# Patient Record
Sex: Female | Born: 1985 | Hispanic: No | Marital: Married | Smoking: Never smoker
Health system: Southern US, Community
[De-identification: ages and names within clinical notes are randomized; demographics above are authoritative.]

## PROBLEM LIST (undated history)

## (undated) DIAGNOSIS — E282 Polycystic ovarian syndrome: Secondary | ICD-10-CM

## (undated) DIAGNOSIS — O24419 Gestational diabetes mellitus in pregnancy, unspecified control: Secondary | ICD-10-CM

## (undated) DIAGNOSIS — D649 Anemia, unspecified: Secondary | ICD-10-CM

## (undated) DIAGNOSIS — N926 Irregular menstruation, unspecified: Secondary | ICD-10-CM

## (undated) DIAGNOSIS — M2240 Chondromalacia patellae, unspecified knee: Secondary | ICD-10-CM

## (undated) DIAGNOSIS — E781 Pure hyperglyceridemia: Secondary | ICD-10-CM

## (undated) DIAGNOSIS — E119 Type 2 diabetes mellitus without complications: Secondary | ICD-10-CM

## (undated) DIAGNOSIS — R7309 Other abnormal glucose: Secondary | ICD-10-CM

## (undated) DIAGNOSIS — E559 Vitamin D deficiency, unspecified: Secondary | ICD-10-CM

## (undated) HISTORY — DX: Anemia, unspecified: D64.9

## (undated) HISTORY — DX: Type 2 diabetes mellitus without complications: E11.9

## (undated) HISTORY — DX: Chondromalacia patellae, unspecified knee: M22.40

## (undated) HISTORY — DX: Pure hyperglyceridemia: E78.1

## (undated) HISTORY — DX: Vitamin D deficiency, unspecified: E55.9

## (undated) HISTORY — DX: Irregular menstruation, unspecified: N92.6

## (undated) HISTORY — DX: Gestational diabetes mellitus in pregnancy, unspecified control: O24.419

## (undated) HISTORY — DX: Other abnormal glucose: R73.09

## (undated) HISTORY — DX: Polycystic ovarian syndrome: E28.2

---

## 2014-01-29 ENCOUNTER — Emergency Department
Admission: EM | Admit: 2014-01-29 | Discharge: 2014-01-29 | Disposition: A | Discharge status: Home / Self Care | Payer: Managed Care, Other (non HMO) | Source: Home / Self Care | Attending: Family Medicine | Admitting: Family Medicine

## 2014-01-29 ENCOUNTER — Encounter: Payer: Self-pay | Admitting: Emergency Medicine

## 2014-01-29 DIAGNOSIS — R6889 Other general symptoms and signs: Secondary | ICD-10-CM

## 2014-01-29 DIAGNOSIS — J069 Acute upper respiratory infection, unspecified: Secondary | ICD-10-CM

## 2014-01-29 MED ORDER — OSELTAMIVIR PHOSPHATE 75 MG PO CAPS: 75.0000 mg | ORAL_CAPSULE | Freq: Two times a day (BID) | ORAL | Status: DC

## 2014-01-29 NOTE — ED Provider Notes (Signed)
CSN: [id number]     Arrival date & time 01/29/14  1912 History   First MD Initiated Contact with Patient 01/29/14 2000     Chief Complaint  Patient presents with  . Fever  . Nasal Congestion  . Headache      HPI  URI Symptoms Onset: 2 days  Description: body aches, chills, rhinorrhea, nasal congestion, cough  Modifying factors:  No flu shot this year. Also on fertility medication.   Symptoms Nasal discharge: yes Fever: yes Sore throat: no Cough: yes Wheezing: no Ear pain: no GI symptoms: no Sick contacts: no  Red Flags  Stiff neck: no Dyspnea: no Rash: no Swallowing difficulty: no  Sinusitis Risk Factors Headache/face pain: no Double sickening: no tooth pain: no  Allergy Risk Factors Sneezing: no Itchy scratchy throat: no Seasonal symptoms: no  Flu Risk Factors Headache: no muscle aches: yes severe fatigue: yes   History reviewed. No pertinent past medical history. History reviewed. No pertinent past surgical history. Family History  Problem Relation Age of Onset  . Heart attack Father   . Diabetes Father    History  Substance Use Topics  . Smoking status: Never Smoker   . Smokeless tobacco: Not on file  . Alcohol Use: No   OB History   Grav Para Term Preterm Abortions TAB SAB Ect Mult Living                 Review of Systems  All other systems reviewed and are negative.      Allergies  Review of patient's allergies indicates no known allergies.  Home Medications    BP 121/81  Pulse 92  Temp(Src) 99.9 F (37.7 C) (Oral)  Resp 18  Ht 5\' 2"  (1.575 m)  Wt 131 lb (59.421 kg)  BMI 23.95 kg/m2  SpO2 100%  LMP 01/19/2014 Physical Exam  Constitutional: She is oriented to person, place, and time. She appears well-developed and well-nourished.  HENT:  Head: Normocephalic and atraumatic.  Right Ear: External ear normal.  Left Ear: External ear normal.  +nasal erythema, rhinorrhea bilaterally, + post oropharyngeal erythema     Eyes: Conjunctivae are normal. Pupils are equal, round, and reactive to light.  Neck: Normal range of motion. Neck supple.  Cardiovascular: Normal rate and regular rhythm.   Pulmonary/Chest: Effort normal and breath sounds normal.  Abdominal: Soft.  Musculoskeletal: Normal range of motion.  Neurological: She is alert and oriented to person, place, and time.  Skin: Skin is warm.    ED Course  Procedures (including critical care time) Labs Review Labs Reviewed - No data to display Imaging Review No results found.    MDM   Final diagnoses:  URI (upper respiratory infection)  Flu-like symptoms    FLu like sxs  Will treat with tamiflu  Oxygenating well.  Discussed supportive care and infectious/resp red flags.  Follow up as needed.     The patient and/or caregiver has been counseled thoroughly with regard to treatment plan and/or medications prescribed including dosage, schedule, interactions, rationale for use, and possible side effects and they verbalize understanding. Diagnoses and expected course of recovery discussed and will return if not improved as expected or if the condition worsens. Patient and/or caregiver verbalized understanding.         Doree AlbeeSteven Slayton Lubitz, MD 02/04/14 1017

## 2014-01-29 NOTE — ED Notes (Signed)
Pt c/o fever, nasal congestion, and HA x 2 days.

## 2014-02-15 ENCOUNTER — Encounter: Payer: Self-pay | Admitting: Emergency Medicine

## 2014-02-15 ENCOUNTER — Emergency Department
Admission: EM | Admit: 2014-02-15 | Discharge: 2014-02-15 | Disposition: A | Discharge status: Home / Self Care | Payer: Managed Care, Other (non HMO) | Source: Home / Self Care | Attending: Family Medicine | Admitting: Family Medicine

## 2014-02-15 ENCOUNTER — Emergency Department (INDEPENDENT_AMBULATORY_CARE_PROVIDER_SITE_OTHER): Payer: Managed Care, Other (non HMO)

## 2014-02-15 DIAGNOSIS — S92424A Nondisplaced fracture of distal phalanx of right great toe, initial encounter for closed fracture: Secondary | ICD-10-CM

## 2014-02-15 DIAGNOSIS — S92919A Unspecified fracture of unspecified toe(s), initial encounter for closed fracture: Secondary | ICD-10-CM

## 2014-02-15 DIAGNOSIS — X58XXXA Exposure to other specified factors, initial encounter: Secondary | ICD-10-CM

## 2014-02-15 DIAGNOSIS — S90111A Contusion of right great toe without damage to nail, initial encounter: Secondary | ICD-10-CM

## 2014-02-15 DIAGNOSIS — S90129A Contusion of unspecified lesser toe(s) without damage to nail, initial encounter: Secondary | ICD-10-CM

## 2014-02-15 DIAGNOSIS — S90221A Contusion of right lesser toe(s) with damage to nail, initial encounter: Secondary | ICD-10-CM

## 2014-02-15 MED ORDER — HYDROCODONE-ACETAMINOPHEN 5-325 MG PO TABS: ORAL_TABLET | ORAL | Status: DC

## 2014-02-15 NOTE — Discharge Instructions (Signed)
Apply ice pack 3 to 4 times daily for about 10 minutes until swelling resolves.   Subungual Hematoma A subungual hematoma is a pocket of blood that collects under the fingernail or toenail. The pressure created by the blood under the nail can cause pain. CAUSES  A subungual hematoma occurs when an injury to the finger or toe causes a blood vessel beneath the nail to break. The injury can occur from a direct blow such as slamming a finger in a door. It can also occur from a repeated injury such as pressure on the foot in a shoe while running. A subungual hematoma is sometimes called runner's toe or tennis toe. SYMPTOMS   Blue or dark blue skin under the nail.  Pain or throbbing in the injured area. DIAGNOSIS  Your caregiver can determine whether you have a subungual hematoma based on your history and a physical exam. If your caregiver thinks you might have a broken (fractured) bone, X-rays may be taken. TREATMENT  Hematomas usually go away on their own over time. Your caregiver may make a hole in the nail to drain the blood. Draining the blood is painless and usually provides significant relief from pain and throbbing. The nail usually grows back normally after this procedure. In some cases, the nail may need to be removed. This is done if there is a cut under the nail that requires stitches (sutures). HOME CARE INSTRUCTIONS   Put ice on the injured area.  Put ice in a plastic bag.  Place a towel between your skin and the bag.  Leave the ice on for 15-20 minutes, 03-04 times a day for the first 1 to 2 days.  Elevate the injured area to help decrease pain and swelling.  If you were given a bandage, wear it for as long as directed by your caregiver.  If part of your nail falls off, trim the remaining nail gently. This prevents the nail from catching on something and causing further injury.  Only take over-the-counter or prescription medicines for pain, discomfort, or fever as directed by  your caregiver. SEEK IMMEDIATE MEDICAL CARE IF:   You have redness or swelling around the nail.  You have yellowish-white fluid (pus) coming from the nail.  Your pain is not controlled with medicine.  You have a fever. MAKE SURE YOU:  Understand these instructions.  Will watch your condition.  Will get help right away if you are not doing well or get worse. Document Released: 11/23/2000 Document Revised: 02/18/2012 Document Reviewed: 11/14/2011 [facility] Patient Information 2014 [place]   Toe Fracture Your caregiver has diagnosed you as having a fractured toe. A toe fracture is a break in the bone of a toe. "Buddy taping" is a way of splinting your broken toe, by taping the broken toe to the toe next to it. This "buddy taping" will keep the injured toe from moving beyond normal range of motion. Buddy taping also helps the toe heal in a more normal alignment. It may take 6 to 8 weeks for the toe injury to heal. HOME CARE INSTRUCTIONS   Leave your toes taped together for as long as directed by your caregiver or until you see a doctor for a follow-up examination. You can change the tape after bathing. Always use a small piece of gauze or cotton between the toes when taping them together. This will help the skin stay dry and prevent infection.  Apply ice to the injury for 15-20 minutes each hour while awake  for the first 2 days. Put the ice in a plastic bag and place a towel between the bag of ice and your skin.  After the first 2 days, apply heat to the injured area. Use heat for the next 2 to 3 days. Place a heating pad on the foot or soak the foot in warm water as directed by your caregiver.  Keep your foot elevated as much as possible to lessen swelling.  Wear sturdy, supportive shoes. The shoes should not pinch the toes or fit tightly against the toes.  Your caregiver may prescribe a rigid shoe if your foot is very swollen.  Your may be given crutches if the pain is too  great and it hurts too much to walk.  Only take over-the-counter or prescription medicines for pain, discomfort, or fever as directed by your caregiver.  If your caregiver has given you a follow-up appointment, it is very important to keep that appointment. Not keeping the appointment could result in a chronic or permanent injury, pain, and disability. If there is any problem keeping the appointment, you must call back to this facility for assistance. SEEK MEDICAL CARE IF:   You have increased pain or swelling, not relieved with medications.  The pain does not get better after 1 week.  Your injured toe is cold when the others are warm. SEEK IMMEDIATE MEDICAL CARE IF:   The toe becomes cold, numb, or white.  The toe becomes hot (inflamed) and red. Document Released: 11/23/2000 Document Revised: 02/18/2012 Document Reviewed: 07/12/2008 [facility] Patient Information 2014 [place], [place].
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
## 2014-02-15 NOTE — ED Provider Notes (Signed)
CSN: [id number]     Arrival date & time 02/15/14  [id number] History   First MD Initiated Contact with Patient 02/15/14 [id number]     Chief Complaint  Patient presents with  . Toe Injury       HPI Comments: Patient reports that a heavy iron skillet fell on her right great toe last night.  Patient is a 28 y.o. female presenting with toe pain. The history is provided by the patient and the spouse.  Toe Pain This is a new problem. The current episode started 6 to 12 hours ago. The problem occurs constantly. The problem has not changed since onset.Associated symptoms comments: none. The symptoms are aggravated by walking. Nothing relieves the symptoms. She has tried nothing for the symptoms.    History reviewed. No pertinent past medical history. History reviewed. No pertinent past surgical history. Family History  Problem Relation Age of Onset  . Heart attack Father   . Diabetes Father    History  Substance Use Topics  . Smoking status: Never Smoker   . Smokeless tobacco: Not on file  . Alcohol Use: No   OB History   Grav Para Term Preterm Abortions TAB SAB Ect Mult Living                 Review of Systems  All other systems reviewed and are negative.    Allergies  Review of patient's allergies indicates not on file.  Home Medications   Current Outpatient Rx  Name  Route  Sig  Dispense  Refill  . clomiPHENE (CLOMID) 50 MG tablet   Oral   Take by mouth daily.         Marland Kitchen. HYDROcodone-acetaminophen (NORCO/VICODIN) 5-325 MG per tablet      May take one tab PO Q6hr prn pain   12 tablet   0   . UNABLE TO FIND      Med Name:          BP 124/82  Pulse 84  Temp(Src) 98.6 F (37 C) (Oral)  Ht 5\' 2"  (1.575 m)  Wt 132 lb (59.875 kg)  BMI 24.14 kg/m2  SpO2 100%  LMP 01/19/2014 Physical Exam  Nursing note and vitals reviewed. Constitutional: She is oriented to person, place, and time. She appears well-developed and well-nourished. No distress.  Eyes: Conjunctivae are  normal. Pupils are equal, round, and reactive to light.  Musculoskeletal: Normal range of motion. She exhibits tenderness.       Right foot: She exhibits tenderness and bony tenderness. She exhibits normal range of motion, no swelling, normal capillary refill, no deformity and no laceration.       Feet:  Right great toe has sub-ungual hematoma and tenderness over distal phalanx and DIP joint.  Distal neurovascular function is intact.   Neurological: She is alert and oriented to person, place, and time.  Skin: Skin is warm and dry.    ED Course  Procedures  Aspiration of Subungual Hematoma:   Risks and benefits of procedure explained to patient and verbal consent obtained.  Using sterile technique, cleansed toenail and toe with Betadine.  With a disposable electric cautery device, burned a 1mm diameter hole in toenail directly over hematoma.  Blood expressed without difficulty.  Applied Bacitracin and bandage.      Imaging Review Dg Toe Great Right  02/15/2014   CLINICAL DATA:  Pain post trauma  EXAM: RIGHT FIRST TOE  COMPARISON:  None.  FINDINGS: Frontal, oblique, and lateral views were obtained.  There is a transversely oriented fracture through the distal aspect of the first distal phalanx in near anatomic alignment. No other fracture. No dislocation. Joint spaces appear intact.  IMPRESSION: Fracture distal aspect first distal phalanx.   Electronically Signed   By: Bretta Bang M.D.   On: 02/15/2014 09:19     MDM   1. Contusion of right great toe without damage to nail   2. Subungual hematoma of toenail of right foot   3. Closed nondisplaced fracture of distal phalanx of right great toe    Right first and second toes strapped using "buddy tape" technique. Rx for Lortab Apply ice pack 3 to 4 times daily until swelling resolves.  Keep toe bandaged and apply bacitracin daily until any bleeding ceases.  Buddy tape toe. Followup with Dr[facility]) if  not improving about two weeks.     Lattie Haw, MD 02/16/14 1248

## 2014-09-13 ENCOUNTER — Other Ambulatory Visit: Payer: Self-pay | Admitting: Sports Medicine

## 2014-09-13 DIAGNOSIS — Z0000 Encounter for general adult medical examination without abnormal findings: Secondary | ICD-10-CM

## 2014-09-13 NOTE — Progress Notes (Signed)
Patient will be coming for new patient visit. She is here with her husband and requests orders for blood work so that this can be ready when she sees me.

## 2014-10-22 LAB — CBC
HCT: 39.5 % (ref 36.0–46.0)
Hemoglobin: 13.2 g/dL (ref 12.0–15.0)
MCH: 28.3 pg (ref 26.0–34.0)
MCHC: 33.4 g/dL (ref 30.0–36.0)
MCV: 84.6 fL (ref 78.0–100.0)
Platelets: 348 K/uL (ref 150–400)
RBC: 4.67 MIL/uL (ref 3.87–5.11)
RDW: 13.4 % (ref 11.5–15.5)
WBC: 6.4 K/uL (ref 4.0–10.5)

## 2014-10-22 LAB — COMPREHENSIVE METABOLIC PANEL
AST: 16 U/L (ref 0–37)
Albumin: 4.3 g/dL (ref 3.5–5.2)
Alkaline Phosphatase: 54 U/L (ref 39–117)
CO2: 25 mEq/L (ref 19–32)
Calcium: 9.2 mg/dL (ref 8.4–10.5)
Potassium: 4.5 mEq/L (ref 3.5–5.3)
Sodium: 138 mEq/L (ref 135–145)
Total Protein: 7.1 g/dL (ref 6.0–8.3)

## 2014-10-22 LAB — LIPID PANEL
Cholesterol: 143 mg/dL (ref 0–200)
HDL: 47 mg/dL (ref >39)
LDL Cholesterol: 78 mg/dL (ref 0–99)
Total CHOL/HDL Ratio: 3 Ratio
Triglycerides: 89 mg/dL (ref <150)
VLDL: 18 mg/dL (ref 0–40)

## 2014-10-22 LAB — COMPREHENSIVE METABOLIC PANEL WITH GFR
ALT: 15 U/L (ref 0–35)
BUN: 6 mg/dL (ref 6–23)
Chloride: 104 meq/L (ref 96–112)
Creat: 0.72 mg/dL (ref 0.50–1.10)
Glucose, Bld: 98 mg/dL (ref 70–99)
Total Bilirubin: 0.6 mg/dL (ref 0.2–1.2)

## 2014-10-22 LAB — TSH: TSH: 2.532 u[IU]/mL (ref 0.350–4.500)

## 2014-10-22 LAB — HEMOGLOBIN A1C
Hgb A1c MFr Bld: 5.8 % — ABNORMAL HIGH (ref <5.7)
Mean Plasma Glucose: 120 mg/dL — ABNORMAL HIGH (ref <117)

## 2014-10-25 ENCOUNTER — Encounter: Payer: Self-pay | Admitting: Sports Medicine

## 2014-10-25 DIAGNOSIS — Z3009 Encounter for other general counseling and advice on contraception: Secondary | ICD-10-CM | POA: Insufficient documentation

## 2014-10-25 DIAGNOSIS — E282 Polycystic ovarian syndrome: Secondary | ICD-10-CM | POA: Insufficient documentation

## 2014-10-25 DIAGNOSIS — R7303 Prediabetes: Secondary | ICD-10-CM | POA: Insufficient documentation

## 2015-01-24 ENCOUNTER — Ambulatory Visit: Payer: Managed Care, Other (non HMO) | Admitting: Sports Medicine

## 2015-10-04 ENCOUNTER — Ambulatory Visit (INDEPENDENT_AMBULATORY_CARE_PROVIDER_SITE_OTHER): Payer: 59

## 2015-10-04 ENCOUNTER — Ambulatory Visit (INDEPENDENT_AMBULATORY_CARE_PROVIDER_SITE_OTHER): Payer: 59 | Admitting: Sports Medicine

## 2015-10-04 ENCOUNTER — Encounter: Payer: Self-pay | Admitting: Sports Medicine

## 2015-10-04 DIAGNOSIS — M542 Cervicalgia: Secondary | ICD-10-CM

## 2015-10-04 DIAGNOSIS — M545 Low back pain: Secondary | ICD-10-CM | POA: Diagnosis not present

## 2015-10-04 DIAGNOSIS — M25511 Pain in right shoulder: Secondary | ICD-10-CM | POA: Diagnosis not present

## 2015-10-04 DIAGNOSIS — M79609 Pain in unspecified limb: Secondary | ICD-10-CM

## 2015-10-04 DIAGNOSIS — M546 Pain in thoracic spine: Secondary | ICD-10-CM | POA: Diagnosis not present

## 2015-10-04 MED ORDER — ACETAMINOPHEN ER 650 MG PO TBCR: 650.0000 mg | EXTENDED_RELEASE_TABLET | Freq: Three times a day (TID) | ORAL | Status: DC | PRN
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
## 2015-11-23 ENCOUNTER — Telehealth: Payer: Self-pay

## 2015-11-23 DIAGNOSIS — R7303 Prediabetes: Secondary | ICD-10-CM

## 2015-11-23 NOTE — Telephone Encounter (Signed)
Orders placed.

## 2015-11-23 NOTE — Telephone Encounter (Signed)
Pt would like to have labs done before appointment 11/30/15. Please advise

## 2015-11-30 ENCOUNTER — Ambulatory Visit (INDEPENDENT_AMBULATORY_CARE_PROVIDER_SITE_OTHER): Payer: 59 | Admitting: Sports Medicine

## 2015-11-30 DIAGNOSIS — M25561 Pain in right knee: Secondary | ICD-10-CM

## 2015-11-30 DIAGNOSIS — M25562 Pain in left knee: Secondary | ICD-10-CM | POA: Insufficient documentation

## 2015-11-30 DIAGNOSIS — M222X1 Patellofemoral disorders, right knee: Secondary | ICD-10-CM | POA: Insufficient documentation

## 2015-11-30 DIAGNOSIS — Z0000 Encounter for general adult medical examination without abnormal findings: Secondary | ICD-10-CM | POA: Diagnosis not present

## 2015-11-30 LAB — CBC
HCT: 41.2 % (ref 36.0–46.0)
Hemoglobin: 13.6 g/dL (ref 12.0–15.0)
MCH: 29 pg (ref 26.0–34.0)
MCHC: 33 g/dL (ref 30.0–36.0)
MCV: 87.8 fL (ref 78.0–100.0)
MPV: 9.7 fL (ref 8.6–12.4)
Platelets: 334 K/uL (ref 150–400)
RBC: 4.69 MIL/uL (ref 3.87–5.11)
RDW: 12.8 % (ref 11.5–15.5)
WBC: 6.4 K/uL (ref 4.0–10.5)

## 2015-11-30 LAB — HEMOGLOBIN A1C
Hgb A1c MFr Bld: 5.5 % (ref <5.7)
Mean Plasma Glucose: 111 mg/dL (ref <117)

## 2015-11-30 NOTE — Progress Notes (Signed)
  Subjective:    CC: Complete physical exam  HPI:  This is a pleasant 29 year old female, she is here for a routine physical, has no complaints and is fasting.  Family planning: Currently planning a insemination with reproductive endocrinologist. Taking Ovidrel and Femara.  Right knee pain: Present for several weeks, localized anteriorly, worse when exercising, no mechanical symptoms, no swelling, no trauma. Pain is moderate, persistent.  Past medical history, Surgical history, Family history not pertinant except as noted below, Social history, Allergies, and medications have been entered into the medical record, reviewed, and no changes needed.   Review of Systems: No headache, visual changes, nausea, vomiting, diarrhea, constipation, dizziness, abdominal pain, skin rash, fevers, chills, night sweats, swollen lymph nodes, weight loss, chest pain, body aches, joint swelling, muscle aches, shortness of breath, mood changes, visual or auditory hallucinations.  Objective:    General: Well Developed, well nourished, and in no acute distress.  Neuro: Alert and oriented x3, extra-ocular muscles intact, sensation grossly intact. Cranial nerves II through XII are intact, motor, sensory, and coordinative functions are all intact. HEENT: Normocephalic, atraumatic, pupils equal round reactive to light, neck supple, no masses, no lymphadenopathy, thyroid nonpalpable. Oropharynx, nasopharynx, external ear canals are unremarkable. Skin: Warm and dry, no rashes noted.  Cardiac: Regular rate and rhythm, no murmurs rubs or gallops.  Respiratory: Clear to auscultation bilaterally. Not using accessory muscles, speaking in full sentences.  Abdominal: Soft, nontender, nondistended, positive bowel sounds, no masses, no organomegaly.  Right Knee: Normal to inspection with no erythema or effusion or obvious bony abnormalities. Tender to palpation under the lateral patellar facet as well as the lateral femoral  trochlear groove. ROM normal in flexion and extension and lower leg rotation. Ligaments with solid consistent endpoints including ACL, PCL, LCL, MCL. Negative Mcmurray's and provocative meniscal tests. Non painful patellar compression. Patellar and quadriceps tendons unremarkable. Hamstring and quadriceps strength is normal.  Impression and Recommendations:    The patient was counselled, risk factors were discussed, anticipatory guidance given.

## 2015-11-30 NOTE — Assessment & Plan Note (Signed)
Pain under the lateral patellar facet and the femoral throat clear groove, this likely represents patellofemoral pain syndrome. We will start conservatively with rehabilitation exercises.

## 2015-11-30 NOTE — Assessment & Plan Note (Signed)
Routine blood work as above, physical performed. Referral to OB/GYN for cervical cancer screening.

## 2015-12-01 LAB — LIPID PANEL
Cholesterol: 152 mg/dL (ref 125–200)
HDL: 50 mg/dL (ref >=46)
LDL Cholesterol: 84 mg/dL (ref <130)
Total CHOL/HDL Ratio: 3 Ratio (ref <=5.0)
Triglycerides: 92 mg/dL (ref <150)
VLDL: 18 mg/dL (ref <30)

## 2015-12-01 LAB — COMPREHENSIVE METABOLIC PANEL
ALT: 14 U/L (ref 6–29)
AST: 18 U/L (ref 10–30)
Albumin: 4.3 g/dL (ref 3.6–5.1)
Alkaline Phosphatase: 49 U/L (ref 33–115)
BUN: 7 mg/dL (ref 7–25)
Calcium: 9.6 mg/dL (ref 8.6–10.2)
Chloride: 98 mmol/L (ref 98–110)
Potassium: 4.4 mmol/L (ref 3.5–5.3)
Total Bilirubin: 0.8 mg/dL (ref 0.2–1.2)
Total Protein: 7 g/dL (ref 6.1–8.1)

## 2015-12-01 LAB — COMPREHENSIVE METABOLIC PANEL WITH GFR
CO2: 32 mmol/L — ABNORMAL HIGH (ref 20–31)
Creat: 0.58 mg/dL (ref 0.50–1.10)
Glucose, Bld: 96 mg/dL (ref 65–99)
Sodium: 136 mmol/L (ref 135–146)

## 2015-12-01 LAB — VITAMIN D 25 HYDROXY (VIT D DEFICIENCY, FRACTURES): Vit D, 25-Hydroxy: 16 ng/mL — ABNORMAL LOW (ref 30–100)

## 2015-12-01 MED ORDER — VITAMIN D (ERGOCALCIFEROL) 1.25 MG (50000 UNIT) PO CAPS: 50000.0000 [IU] | ORAL_CAPSULE | ORAL | Status: DC

## 2015-12-01 NOTE — Addendum Note (Signed)
Addended by: Monica BectonHEKKEKANDAM, Alyne Martinson J on: 12/01/2015 09:20 AM   Modules accepted: Orders

## 2016-01-16 ENCOUNTER — Encounter: Payer: Self-pay | Admitting: Sports Medicine

## 2016-01-16 ENCOUNTER — Ambulatory Visit (INDEPENDENT_AMBULATORY_CARE_PROVIDER_SITE_OTHER): Payer: 59 | Admitting: Sports Medicine

## 2016-01-16 VITALS — BP 110/86 | HR 76 | Resp 18 | Wt 134.0 lb

## 2016-01-16 DIAGNOSIS — R040 Epistaxis: Secondary | ICD-10-CM

## 2016-01-16 NOTE — Progress Notes (Signed)
  Subjective:    CC: "my nose has been bleeding"  HPI: Patient presents with a 3 day history of nose bleeds. She has had 3 episodes that began on Saturday when she and her husband spent the night at a friends house and she woke up with the bleed. The second episode was similar and while she was still at her friends house. The last episode happened at her own home when she noticed some blood crusting on her left nare and picked at it. Each episode was brief (<2 minutes) and resolved spontaneously. She denies any trauma to the area preceding the first event. She notes that she has had nose bleeds before and they are always associated with staying in a dry environment. She notes that her friends house had the heat on and was a dry environment. She denies a history of bleeding in herself or family members. She reports menstrual irregularities, but no increased bleeding was noted. She denies fatigue, pallor, sore throat, ear pain, or a cough.  Past medical history, Surgical history, Family history not pertinant except as noted below, Social history, Allergies, and medications have been entered into the medical record, reviewed, and no changes needed.   Review of Systems: No fevers, chills, night sweats, weight loss, chest pain, or shortness of breath.   Objective:    General: Well Developed, well nourished, and in no acute distress.  Neuro: Alert and oriented x3, extra-ocular muscles intact, sensation grossly intact.  HEENT: Normocephalic, atraumatic, pupils equal round reactive to light, neck supple, no masses, no lymphadenopathy, thyroid nonpalpable, left nare remarkable for coagulated blood and a lateral small ulcer, right nare unremarkable, TM without bulge or erythema bilaterally. Skin: Warm and dry, no rashes. Cardiac: Regular rate and rhythm, no murmurs rubs or gallops, no lower extremity edema.  Respiratory: Clear to auscultation bilaterally. Not using accessory muscles, speaking in full  sentences.  Impression and Recommendations:    Patient's presentation consistent with benign epistaxis secondary to dry environments. Patient reports having a humidifier at home. She was instructed to use the humidifier and prophylacticly apply Vaseline or antibiotic ointment when going into known dry environments.

## 2016-01-16 NOTE — Assessment & Plan Note (Signed)
Left nasal nare, there is a bit of ulceration along the lateral mucosa. Applied antibiotic ointment, recommended application of Vaseline or other antibiotic ointment when exposed to warm and dry environments.

## 2016-01-23 ENCOUNTER — Encounter: Payer: Self-pay | Admitting: Obstetrics & Gynecology

## 2016-01-23 ENCOUNTER — Ambulatory Visit (INDEPENDENT_AMBULATORY_CARE_PROVIDER_SITE_OTHER): Payer: 59 | Admitting: Obstetrics & Gynecology

## 2016-01-23 VITALS — BP 121/79 | HR 90 | Ht 62.0 in | Wt 132.0 lb

## 2016-01-23 DIAGNOSIS — Z1329 Encounter for screening for other suspected endocrine disorder: Secondary | ICD-10-CM | POA: Diagnosis not present

## 2016-01-23 DIAGNOSIS — Z124 Encounter for screening for malignant neoplasm of cervix: Secondary | ICD-10-CM

## 2016-01-23 DIAGNOSIS — Z0000 Encounter for general adult medical examination without abnormal findings: Secondary | ICD-10-CM

## 2016-01-23 DIAGNOSIS — Z1151 Encounter for screening for human papillomavirus (HPV): Secondary | ICD-10-CM

## 2016-01-23 DIAGNOSIS — Z01419 Encounter for gynecological examination (general) (routine) without abnormal findings: Secondary | ICD-10-CM | POA: Diagnosis not present

## 2016-01-23 LAB — TSH: TSH: 2.2 mIU/L

## 2016-01-23 NOTE — Progress Notes (Signed)
Subjective:    Michele Patterson is a 30 y.o. M [place] G1 P0A1 (sp at less than 12 week) female who presents for an annual exam. The patient has no complaints today. She is wanting a pregnancy. She took femara with Dr. Vernice Jefferson at [facility].  She recently moved from [place] to [place]. The patient is sexually active. GYN screening history: last pap: was normal. In 2014 The patient wears seatbelts: yes. The patient participates in regular exercise: yes. Has the patient ever been transfused or tattooed?: no. The patient reports that there is not domestic violence in her life.   Menstrual History: OB History    Gravida Para Term Preterm AB TAB SAB Ectopic Multiple Living   Menarche age: 30  Patient's last menstrual period was 10/25/2015.    The following portions of the patient's history were reviewed and updated as appropriate: allergies, current medications, past family history, past medical history, past social history, past surgical history and problem list.  Review of Systems Pertinent items noted in HPI and remainder of comprehensive ROS otherwise negative. Married for 4 year, denies dyspareunia. She is a Futures trader. Declines a flu vaccine. She took the femara for a total of 3 cycles. She is planning to do IUI.   Objective:    BP 121/79 mmHg  Pulse 90  Ht  (1.575 m)  Wt 132 lb (59.875 kg)  BMI 24.14 kg/m2  LMP 10/25/2015  General Appearance:    Alert, cooperative, no distress, appears stated age  Head:    Normocephalic, without obvious abnormality, atraumatic  Eyes:    PERRL, conjunctiva/corneas clear, EOM's intact, fundi    benign, both eyes  Ears:    Normal TM's and external ear canals, both ears  Nose:   Nares normal, septum midline, mucosa normal, no drainage    or sinus tenderness  Throat:   Lips, mucosa, and tongue normal; teeth and gums normal  Neck:   Supple, symmetrical, trachea midline, no adenopathy;    thyroid:  no  enlargement/tenderness/nodules; no carotid   bruit or JVD  Back:     Symmetric, no curvature, ROM normal, no CVA tenderness  Lungs:     Clear to auscultation bilaterally, respirations unlabored  Chest Wall:    No tenderness or deformity   Heart:    Regular rate and rhythm, S1 and S2 normal, no murmur, rub   or gallop  Breast Exam:    No tenderness, masses, or nipple abnormality  Abdomen:     Soft, non-tender, bowel sounds active all four quadrants,    no masses, no organomegaly  Genitalia:    Normal female without lesion, discharge or tenderness, NSSR, NT, mobile, normal adnexal exam     Extremities:   Extremities normal, atraumatic, no cyanosis or edema  Pulses:   2+ and symmetric all extremities  Skin:   Skin color, texture, turgor normal, no rashes or lesions  Lymph nodes:   Cervical, supraclavicular, and axillary nodes normal  Neurologic:   CNII-XII intact, normal strength, sensation and reflexes    throughout  .    Assessment:    Healthy female exam.    Plan:     Breast self exam technique reviewed and patient encouraged to perform self-exam monthly. Thin prep Pap smear. with cotesting Rec PNVs daily

## 2016-01-23 NOTE — Progress Notes (Signed)
Patient trying to conceive. [facility] Armandina Stammer RN BSN

## 2016-01-24 LAB — CYTOLOGY - PAP

## 2016-02-27 ENCOUNTER — Ambulatory Visit: Payer: 59 | Admitting: Sports Medicine

## 2016-10-19 ENCOUNTER — Telehealth: Payer: Self-pay | Admitting: Sports Medicine

## 2016-10-19 DIAGNOSIS — Z0000 Encounter for general adult medical examination without abnormal findings: Secondary | ICD-10-CM

## 2016-10-19 NOTE — Telephone Encounter (Signed)
Pt called in to schedule CPE for 12/07/16. Pt requested lab orders be put in before her appointment. Thanks!

## 2016-10-23 NOTE — Telephone Encounter (Signed)
Fasting labs ordered

## 2016-12-07 ENCOUNTER — Encounter: Payer: Self-pay | Admitting: Sports Medicine

## 2016-12-07 ENCOUNTER — Ambulatory Visit (INDEPENDENT_AMBULATORY_CARE_PROVIDER_SITE_OTHER): Payer: 59 | Admitting: Sports Medicine

## 2016-12-07 ENCOUNTER — Ambulatory Visit (INDEPENDENT_AMBULATORY_CARE_PROVIDER_SITE_OTHER): Payer: 59

## 2016-12-07 DIAGNOSIS — M25562 Pain in left knee: Secondary | ICD-10-CM

## 2016-12-07 DIAGNOSIS — M25561 Pain in right knee: Secondary | ICD-10-CM

## 2016-12-07 DIAGNOSIS — Z0000 Encounter for general adult medical examination without abnormal findings: Secondary | ICD-10-CM | POA: Diagnosis not present

## 2016-12-07 DIAGNOSIS — M1712 Unilateral primary osteoarthritis, left knee: Secondary | ICD-10-CM | POA: Diagnosis not present

## 2016-12-07 LAB — CBC
HCT: 40.3 % (ref 35.0–45.0)
Hemoglobin: 13.3 g/dL (ref 11.7–15.5)
MCH: 29.4 pg (ref 27.0–33.0)
MCHC: 33 g/dL (ref 32.0–36.0)
MCV: 89 fL (ref 80.0–100.0)
MPV: 9.3 fL (ref 7.5–12.5)
Platelets: 399 K/uL (ref 140–400)
RBC: 4.53 MIL/uL (ref 3.80–5.10)
RDW: 13 % (ref 11.0–15.0)
WBC: 5.7 10*3/uL (ref 3.8–10.8)

## 2016-12-07 LAB — LIPID PANEL
Cholesterol: 152 mg/dL (ref <200)
HDL: 46 mg/dL — ABNORMAL LOW (ref >50)
LDL Cholesterol: 91 mg/dL (ref <100)
Total CHOL/HDL Ratio: 3.3 Ratio (ref <5.0)
Triglycerides: 73 mg/dL (ref <150)
VLDL: 15 mg/dL (ref <30)

## 2016-12-07 LAB — COMPREHENSIVE METABOLIC PANEL WITH GFR
Alkaline Phosphatase: 50 U/L (ref 33–115)
BUN: 8 mg/dL (ref 7–25)
Calcium: 9.4 mg/dL (ref 8.6–10.2)
Potassium: 4.5 mmol/L (ref 3.5–5.3)
Total Protein: 7.2 g/dL (ref 6.1–8.1)

## 2016-12-07 LAB — COMPREHENSIVE METABOLIC PANEL
ALT: 11 U/L (ref 6–29)
AST: 16 U/L (ref 10–30)
Albumin: 4.4 g/dL (ref 3.6–5.1)
CO2: 26 mmol/L (ref 20–31)
Chloride: 102 mmol/L (ref 98–110)
Creat: 0.66 mg/dL (ref 0.50–1.10)
Glucose, Bld: 99 mg/dL (ref 65–99)
Sodium: 139 mmol/L (ref 135–146)
Total Bilirubin: 0.5 mg/dL (ref 0.2–1.2)

## 2016-12-07 LAB — TSH: TSH: 2.77 m[IU]/L

## 2016-12-07 NOTE — Assessment & Plan Note (Signed)
Unremarkable physical. Currently going through fertility treatments. Checking routine blood work. Declines flu vaccine, up-to-date on other vaccinations.

## 2016-12-07 NOTE — Progress Notes (Signed)
  Subjective:    CC: Annual physical exam   HPI:  This is a pleasant 30 year old female here for her physical, she has no complaint with the exception of some mild left knee pain, worse with standing, localized in the posterior medial joint line, no mechanical symptoms, no swelling. No trauma.  She is currently going through fertility treatments with her husband, they are about to start another cycle of intrauterine insemination.  Past medical history:  Negative.  See flowsheet/record as well for more information.  Surgical history: Negative.  See flowsheet/record as well for more information.  Family history: Negative.  See flowsheet/record as well for more information.  Social history: Negative.  See flowsheet/record as well for more information.  Allergies, and medications have been entered into the medical record, reviewed, and no changes needed.    Review of Systems: No headache, visual changes, nausea, vomiting, diarrhea, constipation, dizziness, abdominal pain, skin rash, fevers, chills, night sweats, swollen lymph nodes, weight loss, chest pain, body aches, joint swelling, muscle aches, shortness of breath, mood changes, visual or auditory hallucinations.  Objective:    General: Well Developed, well nourished, and in no acute distress.  Neuro: Alert and oriented x3, extra-ocular muscles intact, sensation grossly intact. Cranial nerves II through XII are intact, motor, sensory, and coordinative functions are all intact. HEENT: Normocephalic, atraumatic, pupils equal round reactive to light, neck supple, no masses, no lymphadenopathy, thyroid nonpalpable. Oropharynx, nasopharynx, external ear canals are unremarkable. Skin: Warm and dry, no rashes noted.  Cardiac: Regular rate and rhythm, no murmurs rubs or gallops.  Respiratory: Clear to auscultation bilaterally. Not using accessory muscles, speaking in full sentences.  Abdominal: Soft, nontender, nondistended, positive bowel sounds,  no masses, no organomegaly.  Left Knee: Normal to inspection with no erythema or effusion or obvious bony abnormalities. Only minimal tenderness at the posterior medial joint line ROM normal in flexion and extension and lower leg rotation. Ligaments with solid consistent endpoints including ACL, PCL, LCL, MCL. Negative Mcmurray's and provocative meniscal tests. Non painful patellar compression. Patellar and quadriceps tendons unremarkable. Hamstring and quadriceps strength is normal.  Impression and Recommendations:    The patient was counselled, risk factors were discussed, anticipatory guidance given.  Annual physical exam Unremarkable physical. Currently going through fertility treatments. Checking routine blood work. Declines flu vaccine, up-to-date on other vaccinations.  Left knee pain Posterior medial joint line pain.  We will start conservatively with x-rays, she can use over-the-counter analgesics as desired. Rehabilitation exercises.  Return to see me in one month, MRI for no better.

## 2016-12-07 NOTE — Assessment & Plan Note (Signed)
Posterior medial joint line pain.  We will start conservatively with x-rays, she can use over-the-counter analgesics as desired. Rehabilitation exercises.  Return to see me in one month, MRI for no better.

## 2016-12-08 ENCOUNTER — Encounter: Payer: Self-pay | Admitting: Obstetrics & Gynecology

## 2016-12-08 LAB — VITAMIN D 25 HYDROXY (VIT D DEFICIENCY, FRACTURES): Vit D, 25-Hydroxy: 30 ng/mL (ref 30–100)

## 2016-12-08 LAB — HEMOGLOBIN A1C
Hgb A1c MFr Bld: 5.5 % (ref <5.7)
Mean Plasma Glucose: 111 mg/dL

## 2016-12-11 ENCOUNTER — Encounter: Payer: 59 | Admitting: Sports Medicine

## 2017-02-15 LAB — OB RESULTS CONSOLE GC/CHLAMYDIA
Chlamydia: NEGATIVE
Gonorrhea: NEGATIVE

## 2017-02-15 LAB — OB RESULTS CONSOLE RUBELLA ANTIBODY, IGM: RUBELLA: IMMUNE

## 2017-02-15 LAB — OB RESULTS CONSOLE HEPATITIS B SURFACE ANTIGEN: HEP B S AG: NEGATIVE

## 2017-02-15 LAB — OB RESULTS CONSOLE ANTIBODY SCREEN: Antibody Screen: NEGATIVE

## 2017-02-15 LAB — OB RESULTS CONSOLE ABO/RH: RH Type: POSITIVE

## 2017-02-15 LAB — OB RESULTS CONSOLE HIV ANTIBODY (ROUTINE TESTING): HIV: NONREACTIVE

## 2017-02-15 LAB — OB RESULTS CONSOLE RPR: RPR: NONREACTIVE

## 2017-06-20 DIAGNOSIS — Z23 Encounter for immunization: Secondary | ICD-10-CM | POA: Diagnosis not present

## 2017-07-09 ENCOUNTER — Encounter: Payer: 59 | Attending: Obstetrics & Gynecology | Admitting: Skilled Nursing Facility1

## 2017-07-09 ENCOUNTER — Encounter: Payer: Self-pay | Admitting: Skilled Nursing Facility1

## 2017-07-09 DIAGNOSIS — Z3A00 Weeks of gestation of pregnancy not specified: Secondary | ICD-10-CM | POA: Diagnosis not present

## 2017-07-09 DIAGNOSIS — O24410 Gestational diabetes mellitus in pregnancy, diet controlled: Secondary | ICD-10-CM

## 2017-07-09 DIAGNOSIS — O99810 Abnormal glucose complicating pregnancy: Secondary | ICD-10-CM | POA: Diagnosis not present

## 2017-07-09 DIAGNOSIS — Z713 Dietary counseling and surveillance: Secondary | ICD-10-CM | POA: Insufficient documentation

## 2017-07-09 NOTE — Progress Notes (Signed)
Pt arrives with her husband. Pt states she is Having heart burn interrupting her sleep. Pt and her husband and inquisitive. Pts husband believes his wife to be overly restricted and states she has not gained any weight in the past 2 weeks.  Pt has a previous diagnosis of PCOS.  [redacted] weeks along.   Pts blood sugar numbers 90, 176, 100, 93, 136, 98, 131,112, 114, 95, 108, 110 Diabetes Self-Management Education  Visit Type: First/Initial  07/09/2017  [name], identified by name and date of birth, is a 31 y.o. female with a diagnosis of Diabetes: Gestational Diabetes.   ASSESSMENT  Height 5\' 2"  (1.575 m), weight 148 lb 11.2 oz (67.4 kg). Body mass index is 27.2 kg/m.      Diabetes Self-Management Education - 07/09/17 0814      Visit Information   Visit Type First/Initial     Initial Visit   Diabetes Type Gestational Diabetes   Are you currently following a meal plan? No   Are you taking your medications as prescribed? Not on Medications     Health Coping   How would you rate your overall health? Good     Psychosocial Assessment   Patient Belief/Attitude about Diabetes Motivated to manage diabetes   Other persons present Spouse/SO     Pre-Education Assessment   Patient understands the diabetes disease and treatment process. Needs Instruction   Patient understands incorporating nutritional management into lifestyle. Needs Instruction   Patient undertands incorporating physical activity into lifestyle. Needs Instruction   Patient understands using medications safely. Needs Instruction   Patient understands monitoring blood glucose, interpreting and using results Needs Instruction   Patient understands prevention, detection, and treatment of acute complications. Needs Instruction   Patient understands prevention, detection, and treatment of chronic complications. Needs Instruction   Patient understands how to develop strategies to address psychosocial issues.  Needs Instruction   Patient understands how to develop strategies to promote health/change behavior. Needs Instruction     Complications   How often do you check your blood sugar? 3-4 times/day   Fasting Blood glucose range (mg/dL) [id number]   Postprandial Blood glucose range (mg/dL) [phone]   Number of hypoglycemic episodes per month 0   Number of hyperglycemic episodes per week 0   Have you had a dilated eye exam in the past 12 months? No   Have you had a dental exam in the past 12 months? No   Are you checking your feet? No     Dietary Intake   Breakfast 1 egg and butter and half avacado   Snack (morning) cheesstick or crackers with peanutbutter    Lunch spinach or other green leafys with bitter gourd and rice and dahl   Snack (afternoon) cheese or tea with milk or 6 ounces whole milk    Dinner lentil pancake and rice   Snack (evening) cheestick or cracker with butter    Beverage(s) water, milk with safron, buttermilk made by her, tea with milk and tea powder and ginger      Exercise   Exercise Type Light (walking / raking leaves)   How many days per week to you exercise? 6   How many minutes per day do you exercise? 60   Total minutes per week of exercise 360     Patient Education   Previous Diabetes Education No   Disease state  Factors that contribute to the development of diabetes   Nutrition management  Role of diet in the treatment  of diabetes and the relationship between the three main macronutrients and blood glucose level;Food label reading, portion sizes and measuring food.;Carbohydrate counting;Reviewed blood glucose goals for pre and post meals and how to evaluate the patients' food intake on their blood glucose level.   Physical activity and exercise  Role of exercise on diabetes management, blood pressure control and cardiac health.   Monitoring Purpose and frequency of SMBG.   Acute complications Taught treatment of hypoglycemia - the 15 rule.   Chronic complications  Relationship between chronic complications and blood glucose control   Psychosocial adjustment Role of stress on diabetes   Preconception care Reviewed with patient blood glucose goals with pregnancy     Individualized Goals (developed by patient)   Nutrition Follow meal plan discussed;General guidelines for healthy choices and portions discussed   Physical Activity Exercise 5-7 days per week;30 minutes per day   Monitoring  test my blood glucose as discussed;test blood glucose pre and post meals as discussed     Post-Education Assessment   Patient understands the diabetes disease and treatment process. Demonstrates understanding / competency   Patient understands incorporating nutritional management into lifestyle. Demonstrates understanding / competency   Patient undertands incorporating physical activity into lifestyle. Demonstrates understanding / competency   Patient understands using medications safely. Demonstrates understanding / competency   Patient understands monitoring blood glucose, interpreting and using results Demonstrates understanding / competency   Patient understands prevention, detection, and treatment of acute complications. Demonstrates understanding / competency   Patient understands prevention, detection, and treatment of chronic complications. Demonstrates understanding / competency   Patient understands how to develop strategies to address psychosocial issues. Demonstrates understanding / competency   Patient understands how to develop strategies to promote health/change behavior. Demonstrates understanding / competency     Outcomes   Expected Outcomes Demonstrated interest in learning. Expect positive outcomes   Future DMSE PRN   Program Status Completed      Individualized Plan for Diabetes Self-Management Training:   Learning Objective:  Patient will have a greater understanding of diabetes self-management. Patient education plan is to attend individual  and/or group sessions per assessed needs and concerns.   Plan:   -Try alkaline water -try a snack or a walk before bed for fasting numbers   Expected Outcomes:  Demonstrated interest in learning. Expect positive outcomes  Education material provided: Living Well with Diabetes, Meal plan card, My Plate and Snack sheet  If problems or questions, patient to contact team via:  Phone  Future DSME appointment: PRN

## 2017-08-07 LAB — OB RESULTS CONSOLE GBS: GBS: NEGATIVE

## 2017-08-29 ENCOUNTER — Other Ambulatory Visit: Payer: Self-pay | Admitting: Obstetrics & Gynecology

## 2017-08-29 ENCOUNTER — Encounter (HOSPITAL_COMMUNITY): Payer: Self-pay | Admitting: *Deleted

## 2017-08-29 ENCOUNTER — Telehealth (HOSPITAL_COMMUNITY): Payer: Self-pay | Admitting: *Deleted

## 2017-08-29 NOTE — Telephone Encounter (Signed)
Preadmission screen  

## 2017-09-02 ENCOUNTER — Encounter (HOSPITAL_COMMUNITY): Payer: Self-pay | Admitting: *Deleted

## 2017-09-06 ENCOUNTER — Encounter (HOSPITAL_COMMUNITY): Payer: Self-pay

## 2017-09-06 ENCOUNTER — Inpatient Hospital Stay (HOSPITAL_COMMUNITY): Admission: AD | Admit: 2017-09-06 | Payer: 59 | Source: Ambulatory Visit | Admitting: Obstetrics & Gynecology

## 2017-09-06 ENCOUNTER — Inpatient Hospital Stay (HOSPITAL_COMMUNITY)
Admission: RE | Admit: 2017-09-06 | Discharge: 2017-09-10 | DRG: 786 | Disposition: A | Discharge status: Home / Self Care | Payer: 59 | Source: Ambulatory Visit | Attending: Obstetrics & Gynecology | Admitting: Obstetrics & Gynecology

## 2017-09-06 DIAGNOSIS — F419 Anxiety disorder, unspecified: Secondary | ICD-10-CM | POA: Diagnosis present

## 2017-09-06 DIAGNOSIS — O24410 Gestational diabetes mellitus in pregnancy, diet controlled: Secondary | ICD-10-CM | POA: Diagnosis present

## 2017-09-06 DIAGNOSIS — O9081 Anemia of the puerperium: Secondary | ICD-10-CM | POA: Diagnosis not present

## 2017-09-06 DIAGNOSIS — O24420 Gestational diabetes mellitus in childbirth, diet controlled: Principal | ICD-10-CM | POA: Diagnosis present

## 2017-09-06 DIAGNOSIS — O411230 Chorioamnionitis, third trimester, not applicable or unspecified: Secondary | ICD-10-CM | POA: Diagnosis present

## 2017-09-06 DIAGNOSIS — D62 Acute posthemorrhagic anemia: Secondary | ICD-10-CM | POA: Diagnosis not present

## 2017-09-06 DIAGNOSIS — Z3A40 40 weeks gestation of pregnancy: Secondary | ICD-10-CM

## 2017-09-06 DIAGNOSIS — Z23 Encounter for immunization: Secondary | ICD-10-CM | POA: Diagnosis not present

## 2017-09-06 DIAGNOSIS — O99344 Other mental disorders complicating childbirth: Secondary | ICD-10-CM | POA: Diagnosis present

## 2017-09-06 DIAGNOSIS — Z3490 Encounter for supervision of normal pregnancy, unspecified, unspecified trimester: Secondary | ICD-10-CM | POA: Diagnosis present

## 2017-09-06 LAB — TYPE AND SCREEN
ABO/RH(D): O POS
Antibody Screen: NEGATIVE

## 2017-09-06 LAB — CBC
HCT: 36.9 % (ref 36.0–46.0)
HEMOGLOBIN: 12.8 g/dL (ref 12.0–15.0)
MCH: 30.5 pg (ref 26.0–34.0)
MCHC: 34.7 g/dL (ref 30.0–36.0)
MCV: 88.1 fL (ref 78.0–100.0)
PLATELETS: 182 10*3/uL (ref 150–400)
RBC: 4.19 MIL/uL (ref 3.87–5.11)
RDW: 14.3 % (ref 11.5–15.5)
WBC: 7.7 10*3/uL (ref 4.0–10.5)

## 2017-09-06 LAB — GLUCOSE, CAPILLARY
GLUCOSE-CAPILLARY: 96 mg/dL (ref 65–99)
Glucose-Capillary: 101 mg/dL — ABNORMAL HIGH (ref 65–99)

## 2017-09-06 LAB — GLUCOSE, RANDOM: GLUCOSE: 116 mg/dL — AB (ref 65–99)

## 2017-09-06 LAB — ABO/RH: ABO/RH(D): O POS

## 2017-09-06 MED ORDER — SOD CITRATE-CITRIC ACID 500-334 MG/5ML PO SOLN
30.0000 mL | ORAL | Status: DC | PRN
  Administered 2017-09-06 – 2017-09-07 (×3): 30 mL via ORAL
  Filled 2017-09-06 (×3): qty 15

## 2017-09-06 MED ORDER — EPHEDRINE 5 MG/ML INJ: 10.0000 mg | INTRAVENOUS | Status: DC | PRN

## 2017-09-06 MED ORDER — LACTATED RINGERS IV SOLN
500.0000 mL | Freq: Once | INTRAVENOUS | Status: AC
  Administered 2017-09-07: 500 mL via INTRAVENOUS

## 2017-09-06 MED ORDER — OXYTOCIN 40 UNITS IN LACTATED RINGERS INFUSION - SIMPLE MED
1.0000 m[IU]/min | INTRAVENOUS | Status: DC
  Administered 2017-09-06: 1 m[IU]/min via INTRAVENOUS

## 2017-09-06 MED ORDER — MISOPROSTOL 25 MCG QUARTER TABLET
25.0000 ug | ORAL_TABLET | ORAL | Status: DC | PRN
  Administered 2017-09-06 (×2): 25 ug via VAGINAL
  Filled 2017-09-06 (×2): qty 1

## 2017-09-06 MED ORDER — TERBUTALINE SULFATE 1 MG/ML IJ SOLN: 0.2500 mg | Freq: Once | INTRAMUSCULAR | Status: DC | PRN

## 2017-09-06 MED ORDER — PHENYLEPHRINE 40 MCG/ML (10ML) SYRINGE FOR IV PUSH (FOR BLOOD PRESSURE SUPPORT)
80.0000 ug | PREFILLED_SYRINGE | INTRAVENOUS | Status: DC | PRN
  Administered 2017-09-07: 80 ug via INTRAVENOUS

## 2017-09-06 MED ORDER — DIPHENHYDRAMINE HCL 50 MG/ML IJ SOLN: 12.5000 mg | INTRAMUSCULAR | Status: DC | PRN

## 2017-09-06 MED ORDER — OXYTOCIN 40 UNITS IN LACTATED RINGERS INFUSION - SIMPLE MED
1.0000 m[IU]/min | INTRAVENOUS | Status: DC
  Filled 2017-09-06: qty 1000

## 2017-09-06 MED ORDER — LACTATED RINGERS IV SOLN
INTRAVENOUS | Status: DC
  Administered 2017-09-06 (×3): via INTRAVENOUS
  Administered 2017-09-07: 125 mL/h via INTRAVENOUS
  Administered 2017-09-07 (×2): via INTRAVENOUS

## 2017-09-06 MED ORDER — ONDANSETRON HCL 4 MG/2ML IJ SOLN
4.0000 mg | Freq: Four times a day (QID) | INTRAMUSCULAR | Status: DC | PRN
  Filled 2017-09-06: qty 2

## 2017-09-06 MED ORDER — ACETAMINOPHEN 325 MG PO TABS: 650.0000 mg | ORAL_TABLET | ORAL | Status: DC | PRN

## 2017-09-06 MED ORDER — LACTATED RINGERS IV SOLN: 500.0000 mL | INTRAVENOUS | Status: DC | PRN

## 2017-09-06 MED ORDER — FENTANYL 2.5 MCG/ML BUPIVACAINE 1/10 % EPIDURAL INFUSION (WH - ANES)
14.0000 mL/h | INTRAMUSCULAR | Status: DC | PRN
  Administered 2017-09-07: 14 mL/h via EPIDURAL
  Administered 2017-09-07: 12 mL/h via EPIDURAL
  Filled 2017-09-06 (×2): qty 100

## 2017-09-06 MED ORDER — PHENYLEPHRINE 40 MCG/ML (10ML) SYRINGE FOR IV PUSH (FOR BLOOD PRESSURE SUPPORT)
80.0000 ug | PREFILLED_SYRINGE | INTRAVENOUS | Status: DC | PRN
  Administered 2017-09-07: 80 ug via INTRAVENOUS
  Filled 2017-09-06: qty 10

## 2017-09-06 MED ORDER — EPHEDRINE 5 MG/ML INJ
10.0000 mg | INTRAVENOUS | Status: DC | PRN
Start: 2017-09-06 — End: 2017-09-08

## 2017-09-06 NOTE — Progress Notes (Signed)
Patient ID: Michele Patterson, female   DOB: Jul 25, 1986, 31 y.o.   MRN: [id number] IOL at 40 wks for A1GDM.  Cytotec x 2, now favorable cervix and is on pitocin. Ambulating off and on.  FHT 140s/ + accels /no decels,/ moderate variability- category I Toco q 1-3 min Awaiting active labor.

## 2017-09-06 NOTE — Anesthesia Pain Management Evaluation Note (Signed)
  CRNA Pain Management Visit Note  Patient: Michele Patterson, 31 y.o., female  "Hello I am a member of the anesthesia team at [facility]. We have an anesthesia team available at all times to provide care throughout the hospital, including epidural management and anesthesia for C-section. I don't know your plan for the delivery whether it a natural birth, water birth, IV sedation, nitrous supplementation, doula or epidural, but we want to meet your pain goals."   1.Was your pain managed to your expectations on prior hospitalizations?   No prior hospitalizations  2.What is your expectation for pain management during this hospitalization?     Epidural  3.How can we help you reach that goal? Epidural when ready.  Record the patient's initial score and the patient's pain goal.   Pain: 0  Pain Goal: Unable to give a number, but will ask for pain control when ready.  The [facility] wants you to be able to say your pain was always managed very well.  Adalay Azucena L 09/06/2017

## 2017-09-06 NOTE — H&P (Signed)
Michele Patterson is a 31 y.o. female presenting for labor induction for A1GDM. Well controlled on diet.  Very anxious patient since several years after losing both her parents to medical illnesses. Husband very supportive and does most of the talking. She is fluent in [place].  PCOS,  Femara/[facility]/ IUI conception [facility]).. H/o one SAB.   GDM at 28 wks, overall well controlled, except when she eats "sweets" here and there due to craving.  Normal serial growth sono. Last sono 9/20 - 7'9" 63%, BPD 92%, Vtx, AFI 17 cm.  Normal Pap this pregnancy.   OB History    Gravida Para Term Preterm AB Living   2       1     SAB TAB Ectopic Multiple Live Births   1             Past Medical History:  Diagnosis Date  . Anemia   . Diabetes mellitus without complication (HCC)   . Gestational diabetes   . Irregular periods/menstrual cycles   . PCOS (polycystic ovarian syndrome)   . Runner's knee    No past surgical history on file. Family History: family history includes Cancer (age of onset: 14) in her mother; Diabetes in her father and paternal grandfather; Heart attack in her father. Social History:  reports that she has never smoked. She has never used smokeless tobacco. She reports that she does not drink alcohol or use drugs.     Maternal Diabetes: Yes:  Diabetes Type:  Diet controlled Genetic Screening: Normal Maternal Ultrasounds/Referrals: Normal Fetal Ultrasounds or other Referrals:  None Maternal Substance Abuse:  No Significant Maternal Medications:  None Significant Maternal Lab Results:  Lab values include: Group B Strep negative Other Comments:  None  ROS neg History   Last menstrual period 11/30/2016. Exam Physical Exam  Physical exam:  A&O x 3, no acute distress. Pleasant HEENT neg, no thyromegaly Lungs CTA bilat CV RRR, S1S2 normal Abdo soft, non tender, non acute Extr no edema/ tenderness Pelvic closed, long FHT 140s/ + accels/ no decels/ mod variab-  reassuring Toco irreg   Prenatal labs: ABO, Rh:  O(+) Antibody:  Negative  Rubella: Immune (03/09 0000) RPR: Nonreactive (03/09 0000)  HBsAg: Negative (03/09 0000)  HIV: Non-reactive (03/09 0000)  GBS: Negative (08/29 0000)   Assessment/Plan: 31 yo, G2P0, 40 wks, IOL for GDM-A1.  Cytotec x 1-2 doses as needed, then pitocin Epidural ok, Android pelvis, watch progress  FHT- Category I  Michele Patterson R 09/06/2017, 7:27 AM

## 2017-09-07 ENCOUNTER — Inpatient Hospital Stay (HOSPITAL_COMMUNITY): Payer: 59 | Admitting: Anesthesiology

## 2017-09-07 ENCOUNTER — Encounter (HOSPITAL_COMMUNITY)
Admission: RE | Disposition: A | Discharge status: Home / Self Care | Payer: Self-pay | Source: Ambulatory Visit | Attending: Obstetrics & Gynecology

## 2017-09-07 LAB — GLUCOSE, CAPILLARY
GLUCOSE-CAPILLARY: 107 mg/dL — AB (ref 65–99)
GLUCOSE-CAPILLARY: 119 mg/dL — AB (ref 65–99)
GLUCOSE-CAPILLARY: 91 mg/dL (ref 65–99)
GLUCOSE-CAPILLARY: 117 mg/dL — AB (ref 65–99)
Glucose-Capillary: 102 mg/dL — ABNORMAL HIGH (ref 65–99)

## 2017-09-07 LAB — RPR: RPR Ser Ql: NONREACTIVE

## 2017-09-07 SURGERY — Surgical Case
Anesthesia: Epidural

## 2017-09-07 MED ORDER — SCOPOLAMINE 1 MG/3DAYS TD PT72
MEDICATED_PATCH | TRANSDERMAL | Status: DC | PRN
  Administered 2017-09-07: 1 via TRANSDERMAL

## 2017-09-07 MED ORDER — OXYTOCIN 40 UNITS IN LACTATED RINGERS INFUSION - SIMPLE MED: 1.0000 m[IU]/min | INTRAVENOUS | Status: DC

## 2017-09-07 MED ORDER — DIPHENHYDRAMINE HCL 50 MG/ML IJ SOLN: 12.5000 mg | INTRAMUSCULAR | Status: DC | PRN

## 2017-09-07 MED ORDER — LIDOCAINE HCL (PF) 1 % IJ SOLN
INTRAMUSCULAR | Status: DC | PRN
  Administered 2017-09-07 (×2): 6 mL via EPIDURAL

## 2017-09-07 MED ORDER — PHENYLEPHRINE 40 MCG/ML (10ML) SYRINGE FOR IV PUSH (FOR BLOOD PRESSURE SUPPORT): 80.0000 ug | PREFILLED_SYRINGE | INTRAVENOUS | Status: DC | PRN

## 2017-09-07 MED ORDER — LACTATED RINGERS IV SOLN: 500.0000 mL | Freq: Once | INTRAVENOUS | Status: DC

## 2017-09-07 MED ORDER — SODIUM BICARBONATE 8.4 % IV SOLN
INTRAVENOUS | Status: DC | PRN
  Administered 2017-09-07: 3 mL via EPIDURAL
  Administered 2017-09-07 (×3): 5 mL via EPIDURAL
  Administered 2017-09-07: 2 mL via EPIDURAL

## 2017-09-07 MED ORDER — OXYTOCIN 10 UNIT/ML IJ SOLN
INTRAMUSCULAR | Status: AC
  Filled 2017-09-07: qty 4

## 2017-09-07 MED ORDER — NALBUPHINE HCL 10 MG/ML IJ SOLN
10.0000 mg | Freq: Once | INTRAMUSCULAR | Status: AC
  Administered 2017-09-07: 10 mg via INTRAVENOUS
  Filled 2017-09-07: qty 1

## 2017-09-07 MED ORDER — LIDOCAINE-EPINEPHRINE (PF) 2 %-1:200000 IJ SOLN
INTRAMUSCULAR | Status: AC
  Filled 2017-09-07: qty 20

## 2017-09-07 MED ORDER — ONDANSETRON HCL 4 MG/2ML IJ SOLN
INTRAMUSCULAR | Status: AC
  Filled 2017-09-07: qty 4

## 2017-09-07 MED ORDER — EPHEDRINE 5 MG/ML INJ: 10.0000 mg | INTRAVENOUS | Status: DC | PRN

## 2017-09-07 MED ORDER — MEPERIDINE HCL 25 MG/ML IJ SOLN
INTRAMUSCULAR | Status: AC
  Filled 2017-09-07: qty 1

## 2017-09-07 MED ORDER — SCOPOLAMINE 1 MG/3DAYS TD PT72
MEDICATED_PATCH | TRANSDERMAL | Status: AC
  Filled 2017-09-07: qty 1

## 2017-09-07 MED ORDER — PHENYLEPHRINE 40 MCG/ML (10ML) SYRINGE FOR IV PUSH (FOR BLOOD PRESSURE SUPPORT)
PREFILLED_SYRINGE | INTRAVENOUS | Status: AC
  Filled 2017-09-07: qty 10

## 2017-09-07 MED ORDER — MORPHINE SULFATE (PF) 0.5 MG/ML IJ SOLN
INTRAMUSCULAR | Status: DC | PRN
  Administered 2017-09-07: 4 mg via EPIDURAL
  Administered 2017-09-07 (×2): .5 mg via EPIDURAL

## 2017-09-07 MED ORDER — PHENYLEPHRINE HCL 10 MG/ML IJ SOLN
INTRAMUSCULAR | Status: DC | PRN
Start: 2017-09-07 — End: 2017-09-07
  Administered 2017-09-07 (×2): 80 ug via INTRAVENOUS

## 2017-09-07 MED ORDER — CEFAZOLIN SODIUM-DEXTROSE 2-4 GM/100ML-% IV SOLN
2.0000 g | INTRAVENOUS | Status: AC
  Administered 2017-09-07: 2 g via INTRAVENOUS
  Filled 2017-09-07: qty 100

## 2017-09-07 MED ORDER — MEPERIDINE HCL 25 MG/ML IJ SOLN
INTRAMUSCULAR | Status: DC | PRN
  Administered 2017-09-07 (×2): 12.5 mg via INTRAVENOUS

## 2017-09-07 MED ORDER — SODIUM CHLORIDE 0.9 % IR SOLN
Status: DC | PRN
  Administered 2017-09-07: 1

## 2017-09-07 MED ORDER — SODIUM BICARBONATE 8.4 % IV SOLN
INTRAVENOUS | Status: AC
  Filled 2017-09-07: qty 50

## 2017-09-07 MED ORDER — PROMETHAZINE HCL 25 MG/ML IJ SOLN
25.0000 mg | Freq: Once | INTRAMUSCULAR | Status: AC
  Administered 2017-09-07: 25 mg via INTRAVENOUS
  Filled 2017-09-07: qty 1

## 2017-09-07 SURGICAL SUPPLY — 35 items
BENZOIN TINCTURE PRP APPL 2/3 (GAUZE/BANDAGES/DRESSINGS) ×3 IMPLANT
CHLORAPREP W/TINT 26ML (MISCELLANEOUS) ×3 IMPLANT
CLAMP CORD UMBIL (MISCELLANEOUS) IMPLANT
CLOSURE WOUND 1/2 X4 (GAUZE/BANDAGES/DRESSINGS) ×1
CLOTH BEACON ORANGE TIMEOUT ST (SAFETY) ×3 IMPLANT
CONTAINER PREFILL 10% NBF 15ML (MISCELLANEOUS) IMPLANT
DRSG OPSITE POSTOP 4X10 (GAUZE/BANDAGES/DRESSINGS) ×3 IMPLANT
ELECT REM PT RETURN 9FT ADLT (ELECTROSURGICAL) ×3
ELECTRODE REM PT RTRN 9FT ADLT (ELECTROSURGICAL) ×1 IMPLANT
EXTRACTOR VACUUM KIWI (MISCELLANEOUS) IMPLANT
EXTRACTOR VACUUM M CUP 4 TUBE (SUCTIONS) IMPLANT
EXTRACTOR VACUUM M CUP 4' TUBE (SUCTIONS)
GLOVE BIO SURGEON STRL SZ7 (GLOVE) ×3 IMPLANT
GLOVE BIOGEL PI IND STRL 7.0 (GLOVE) ×2 IMPLANT
GLOVE BIOGEL PI INDICATOR 7.0 (GLOVE) ×4
GOWN STRL REUS W/TWL LRG LVL3 (GOWN DISPOSABLE) ×6 IMPLANT
KIT ABG SYR 3ML LUER SLIP (SYRINGE) IMPLANT
NEEDLE HYPO 25X5/8 SAFETYGLIDE (NEEDLE) IMPLANT
NS IRRIG 1000ML POUR BTL (IV SOLUTION) ×3 IMPLANT
PACK C SECTION WH (CUSTOM PROCEDURE TRAY) ×3 IMPLANT
PAD OB MATERNITY 4.3X12.25 (PERSONAL CARE ITEMS) ×3 IMPLANT
RTRCTR C-SECT PINK 25CM LRG (MISCELLANEOUS) ×3 IMPLANT
STRIP CLOSURE SKIN 1/2X4 (GAUZE/BANDAGES/DRESSINGS) ×2 IMPLANT
SUT MON AB-0 CT1 36 (SUTURE) ×6 IMPLANT
SUT PLAIN 0 NONE (SUTURE) IMPLANT
SUT PLAIN 2 0 (SUTURE) ×2
SUT PLAIN ABS 2-0 CT1 27XMFL (SUTURE) ×1 IMPLANT
SUT VIC AB 0 CT1 27 (SUTURE) ×4
SUT VIC AB 0 CT1 27XBRD ANBCTR (SUTURE) ×2 IMPLANT
SUT VIC AB 2-0 CT1 27 (SUTURE) ×2
SUT VIC AB 2-0 CT1 TAPERPNT 27 (SUTURE) ×1 IMPLANT
SUT VIC AB 4-0 KS 27 (SUTURE) ×6 IMPLANT
SUT VICRYL 0 TIES 12 18 (SUTURE) IMPLANT
TOWEL OR 17X24 6PK STRL BLUE (TOWEL DISPOSABLE) ×3 IMPLANT
TRAY FOLEY BAG SILVER LF 14FR (SET/KITS/TRAYS/PACK) IMPLANT

## 2017-09-07 NOTE — Anesthesia Procedure Notes (Signed)
Epidural Patient location during procedure: OB Start time: 09/07/2017 4:55 AM End time: 09/07/2017 5:23 AM  Staffing Anesthesiologist: Jairo Ben Performed: anesthesiologist   Preanesthetic Checklist Completed: patient identified, surgical consent, pre-op evaluation, timeout performed, IV checked, risks and benefits discussed and monitors and equipment checked  Epidural Patient position: sitting Prep: site prepped and draped and DuraPrep Patient monitoring: blood pressure, continuous pulse ox and heart rate Approach: midline Location: L3-L4 Injection technique: LOR air  Needle:  Needle type: Tuohy  Needle gauge: 17 G Needle length: 9 cm Needle insertion depth: 4 cm Catheter type: closed end flexible Catheter size: 19 Gauge Catheter at skin depth: 9 cm Test dose: negative (1% lidocaine)  Assessment Events: blood not aspirated, injection not painful, no injection resistance, negative IV test and no paresthesia  Additional Notes Pt identified in Labor room.  Monitors applied. Working IV access confirmed. Sterile prep, drape lumbar spine.  1% lido local L 3,4.  #17ga Touhy LOR air at 4 cm L 3,4, cath in easily to 9 cm skin. Test dose OK, cath dosed and infusion begun.  Patient asymptomatic, VSS, no heme aspirated, tolerated well.  Sandford Craze, MDReason for block:procedure for pain

## 2017-09-07 NOTE — Progress Notes (Signed)
Michele Patterson is a 31 y.o. G2P0010 at [redacted]w[redacted]d IOL for A1GDM.  Subjective: Feels better with epidural   Objective: BP (!) 118/58   Pulse 83   Temp 99.9 F (37.7 C) (Oral)   Resp 16   Ht  (1.575 m)   Wt 152 lb (68.9 kg)   LMP 11/30/2016   SpO2 100%   BMI 27.80 kg/m   FHT:  FHR: 140s bpm, variability: moderate,  accelerations:  Present,  decelerations:  Absent UC:   irregular, every 5-6 minutes, contractions are long SVE:   Dilation: 6 Effacement (%): 100 Station: 0 Exam by:: Michele Patterson  Moulding and caput noted. Borderline pelvis  Assessment / Plan: Induction of labor due to gestational diabetes,  progressing well on pitocin. Active labor, anticipate better dilatation if we can get better UCs pattern. IUPC is in place FHT category I No maternal/ fetal concerns. Working towards vaginal delivery.   Michele Patterson 09/07/2017, 6:22 PM

## 2017-09-07 NOTE — Progress Notes (Signed)
Michele Patterson is a 31 y.o. G2P0010 at [redacted]w[redacted]d IOL for A1GDM.  Subjective: Feels better with epidural   Objective: BP 111/76 (BP Location: Left Arm)   Pulse 85   Temp 98.3 F (36.8 C) (Oral)   Resp 16   Ht  (1.575 m)   Wt 152 lb (68.9 kg)   LMP 11/30/2016   SpO2 99%   BMI 27.80 kg/m    FHT:  FHR: 140s bpm, variability: moderate,  accelerations:  Present,  decelerations:  Absent UC:   irregular, every 2-5 minutes SVE:   Dilation: 4 Effacement (%): 100 Station: -2 Exam by:: MD Caresse Sedivy AROM of forebag, clear. IUPC placed.   Labs: Lab Results  Component Value Date   WBC 7.7 09/06/2017   HGB 12.8 09/06/2017   HCT 36.9 09/06/2017   MCV 88.1 09/06/2017   PLT 182 09/06/2017    Assessment / Plan: Induction of labor due to gestational diabetes,  progressing well on pitocin  Labor: Progressing normally Preeclampsia:  N/A Fetal Wellbeing:  Category I Pain Control:  Epidural I/D:  n/a Anticipated MOD:  Guarded  [name] and assess progress.  Restart pitocin as needed  Romaine Maciolek R 09/07/2017, 9:55 AM

## 2017-09-07 NOTE — Op Note (Signed)
Cesarean Section Procedure Note 09/07/2017 Michele Patterson  Indications: Arrest of dilatation at 6 cm. Labor induction for GDM-A1 since 09/06/17   Pre-operative Diagnosis: Primary Cesarean Section arrest of dilation.   Post-operative Diagnosis: Same   Surgeon:  Shea Evans, MD   Assistants: Arlan Organ. CNM  Anesthesia: epidural   Procedure Details:  The patient was seen in the Labor Room. Arrest of dilatation reviewed with patient and recommended C-section. The risks, benefits, complications, treatment options, and expected outcomes were discussed with the patient. The patient concurred with the proposed plan, giving informed consent. identified as Teaching laboratory technician and the procedure verified as C-Section Delivery.  She was brought to the OR, 2 gm Ancef given. A Time Out was held and the above information confirmed.  After epidural anesthesia was adequate, the patient was draped and prepped in the usual sterile manner. Foley was draining urine well. A Pfannenstiel Incision was made and carried down through the subcutaneous tissue to the fascia. Fascial incision was made and extended transversely. The fascia was separated from the underlying rectus tissue superiorly and inferiorly. The peritoneum was identified and entered. Peritoneal incision was extended longitudinally. Alexis-O retractor placed. The utero-vesical peritoneal reflection was incised transversely and the bladder flap was bluntly freed from the lower uterine segment. Lower segment was distended. A low transverse uterine incision was made. Amniotic fluid appeared turbid and had a smell consistent with chorioamnionitis. Baby was in LOT position. Delivered from cephalic presentation was a vigorous FEMALE at 11.02 pm on 09/07/17, with Apgar scores of 9 at one minute and 9 at five minutes. Delayed cord clamping done at 1 minute and infant handed off to NICU team in attendance. Cord ph was not sent,  the umbilical cord blood was  obtained for evaluation. The placenta was removed Intact and appeared normal. Lower segment was boggy vut no active bleeding noted. The uterine outline, tubes and ovaries appeared normal. The uterine incision was closed with running locked sutures of 0 Monocryl followed by a second imbricating layer. Hemostasis was observed. Alexis retractor removed. Peritoneal closure done with 2-0 Vicryl. The fascia was then reapproximated with running sutures of 0Vicryl. The subcuticular closure was performed using 2-0plain gut. The skin was closed with 4-0Vicryl. Steristrips and sterile dressing placed.   Instrument, sponge, and needle counts were correct prior the abdominal closure and were correct at the conclusion of the case.   Findings:  Amniotic fluid was turbid, odor noted in the uterus and placenta was warm, consistent with chorioamnionitis (with low grade temp in mother). Baby BOY was delivered at 11.02 pm on 09/07/17, cephalic from [facility] hysterotomy. Apgars 9 and 9. 2 layer closure of hysterotomy. Normal ovaries and tubes.    Estimated Blood Loss: 617 cc  Total IV Fluids: 2200 cc LR  Urine Output: 150CC OF clear urine  Specimens: placenta, cord blood  Complications: no complications  Disposition: PACU - hemodynamically stable.   Maternal Condition: stable   Baby condition / location:  Couplet care / Skin to Skin  Attending Attestation: I performed the procedure.   Signed: Surgeon(s): Shea Evans, MD

## 2017-09-07 NOTE — Anesthesia Preprocedure Evaluation (Addendum)
Anesthesia Evaluation  Patient identified by MRN, date of birth, ID bandGeneral Assessment Comment:Pt sedated, but desires epidural and verbalizes understanding   History of Anesthesia Complications Negative for: history of anesthetic complications  Airway Mallampati: IV  TM Distance: >3 FB Neck ROM: Full    Dental  (+) Dental Advisory Given   Pulmonary neg pulmonary ROS,    breath sounds clear to auscultation       Cardiovascular negative cardio ROS   Rhythm:Regular Rate:Normal     Neuro/Psych negative neurological ROS     GI/Hepatic Neg liver ROS, GERD  ,  Endo/Other  diabetes (diet controlled)Polycystic ovarian  Renal/GU      Musculoskeletal   Abdominal   Peds  Hematology plt 182k   Anesthesia Other Findings   Reproductive/Obstetrics (+) Pregnancy                            Anesthesia Physical Anesthesia Plan  ASA: III  Anesthesia Plan: Epidural   Post-op Pain Management:    Induction:   PONV Risk Score and Plan: Treatment may vary due to age or medical condition  Airway Management Planned: Natural Airway  Additional Equipment:   Intra-op Plan:   Post-operative Plan:   Informed Consent: I have reviewed the patients History and Physical, chart, labs and discussed the procedure including the risks, benefits and alternatives for the proposed anesthesia with the patient or authorized representative who has indicated his/her understanding and acceptance.   Dental advisory given and Consent reviewed with POA  Plan Discussed with:   Anesthesia Plan Comments: (Patient identified. Risks/Benefits/Options discussed with patient and husband including but not limited to bleeding, infection, nerve damage, paralysis, failed block, incomplete pain control, headache, blood pressure changes, nausea, vomiting, reactions to medication both or allergic, itching and postpartum back pain.  Confirmed with bedside nurse the patient's most recent platelet count. Confirmed with patient that they are not currently taking any anticoagulation, have any bleeding history or any family history of bleeding disorders. Patient expressed understanding and wished to proceed. All questions were answered. )       Anesthesia Quick Evaluation

## 2017-09-07 NOTE — Progress Notes (Signed)
Provider at bedside discussing risk and benefits of cesarean delivery. Pt and family understand. All questions answered by provider. Consent reviewed by pt family and provider. No further questions. Consent signed, witnessed and verified. Prep for OR.

## 2017-09-07 NOTE — Progress Notes (Signed)
Patient ID: Michele Patterson, female   DOB: 08-30-1986, 31 y.o.   MRN: [id number] Pt requesting C/section due to pain and being just 2 cm after close to 24 hr IOL. Reviewed IOL process and long period in latent phase. She was advised Epidural and I spoke with her and her husband about safety though her concern of back pain with epidural remains. Reviewed labor pain intolerance is not a good indication for C/secton and though she may need one for variety of reasons, at this moment she should consider pain relief options, rest and allow more time to get into active labor. She does have borderline pelvis, other than that, FHT is in category I and her vital signs are nl.  She agrees to try IV pain meds. Nubain/ Phenergan ordered.  Reassess progress. UCs are regular, contractions are spontaneous since pitocin was turned off.   V.Rusty Glodowski, MD

## 2017-09-07 NOTE — Transfer of Care (Signed)
Immediate Anesthesia Transfer of Care Note  Patient: Michele Patterson  Procedure(s) Performed: Procedure(s): CESAREAN SECTION (N/A)  Patient Location: PACU  Anesthesia Type:Epidural  Level of Consciousness: awake, alert  and oriented  Airway & Oxygen Therapy: Patient Spontanous Breathing  Post-op Assessment: Report given to RN and Post -op Vital signs reviewed and stable  Post vital signs: Reviewed and stable  Last Vitals:  Vitals:   09/07/17 2100 09/07/17 2205  BP: 114/63 101/69  Pulse: 85 83  Resp: 18 18  Temp:    SpO2:      Last Pain:  Vitals:   09/07/17 2030  TempSrc:   PainSc: Asleep         Complications: No apparent anesthesia complications

## 2017-09-07 NOTE — Progress Notes (Signed)
Michele Patterson is a 31 y.o. G2P0010 at [redacted]w[redacted]d IOL for A1GDM.  Subjective: No complaints   Objective: BP 114/63   Pulse 85   Temp 100 F (37.8 C) (Oral)   Resp 18   Ht  (1.575 m)   Wt 152 lb (68.9 kg)   LMP 11/30/2016   SpO2 100%   BMI 27.80 kg/m  Temp 100'  FHT:  Change in baseline to 150, moderate variab/ + accels/ no decels - category I UC:   Regular long contractions q 3 min SVE:  6/90%, cx feels swollen. -1. No progress in dilation and descent. moulding and caput + Borderline pelvis  Assessment / Plan: IOL for GDM A1. Arrest of dilatation. FHT category I. Elevated maternal temperature but no signs of chorioamnionitis Recommend C/section.  Risks/complications of surgery reviewed incl infection, bleeding, damage to internal organs including bladder, bowels, ureters, blood vessels, other risks from anesthesia, VTE and delayed complications of any surgery, complications in future surgery reviewed. Also discussed neonatal complications incl difficult delivery, laceration, vacuum assistance, TTN etc. Pt understands and agrees, all concerns addressed.   Awaiting OR availability  Michele Patterson R 09/07/2017, 9:35 PM

## 2017-09-08 ENCOUNTER — Encounter (HOSPITAL_COMMUNITY): Payer: Self-pay

## 2017-09-08 LAB — CBC
HCT: 31.8 % — ABNORMAL LOW (ref 36.0–46.0)
Hemoglobin: 10.8 g/dL — ABNORMAL LOW (ref 12.0–15.0)
MCH: 30.2 pg (ref 26.0–34.0)
MCHC: 34 g/dL (ref 30.0–36.0)
MCV: 88.8 fL (ref 78.0–100.0)
PLATELETS: 133 10*3/uL — AB (ref 150–400)
RBC: 3.58 MIL/uL — AB (ref 3.87–5.11)
RDW: 14.4 % (ref 11.5–15.5)
WBC: 11.2 10*3/uL — AB (ref 4.0–10.5)

## 2017-09-08 LAB — GLUCOSE, CAPILLARY: GLUCOSE-CAPILLARY: 109 mg/dL — AB (ref 65–99)

## 2017-09-08 MED ORDER — ACETAMINOPHEN 325 MG PO TABS: 650.0000 mg | ORAL_TABLET | ORAL | Status: DC | PRN

## 2017-09-08 MED ORDER — IBUPROFEN 600 MG PO TABS
600.0000 mg | ORAL_TABLET | Freq: Four times a day (QID) | ORAL | Status: DC
  Administered 2017-09-08 – 2017-09-10 (×9): 600 mg via ORAL
  Filled 2017-09-08 (×9): qty 1

## 2017-09-08 MED ORDER — ZOLPIDEM TARTRATE 5 MG PO TABS: 5.0000 mg | ORAL_TABLET | Freq: Every evening | ORAL | Status: DC | PRN

## 2017-09-08 MED ORDER — WITCH HAZEL-GLYCERIN EX PADS: 1.0000 "application " | MEDICATED_PAD | CUTANEOUS | Status: DC | PRN

## 2017-09-08 MED ORDER — SIMETHICONE 80 MG PO CHEW
80.0000 mg | CHEWABLE_TABLET | ORAL | Status: DC
  Administered 2017-09-09 – 2017-09-10 (×2): 80 mg via ORAL
  Filled 2017-09-08 (×4): qty 1

## 2017-09-08 MED ORDER — SIMETHICONE 80 MG PO CHEW
80.0000 mg | CHEWABLE_TABLET | ORAL | Status: DC | PRN
  Filled 2017-09-08: qty 1

## 2017-09-08 MED ORDER — OXYTOCIN 40 UNITS IN LACTATED RINGERS INFUSION - SIMPLE MED: 2.5000 [IU]/h | INTRAVENOUS | Status: AC

## 2017-09-08 MED ORDER — SIMETHICONE 80 MG PO CHEW
80.0000 mg | CHEWABLE_TABLET | Freq: Three times a day (TID) | ORAL | Status: DC
  Administered 2017-09-08 – 2017-09-10 (×5): 80 mg via ORAL
  Filled 2017-09-08 (×9): qty 1

## 2017-09-08 MED ORDER — DIPHENHYDRAMINE HCL 25 MG PO CAPS
25.0000 mg | ORAL_CAPSULE | Freq: Four times a day (QID) | ORAL | Status: DC | PRN
  Filled 2017-09-08: qty 1

## 2017-09-08 MED ORDER — MENTHOL 3 MG MT LOZG
1.0000 | LOZENGE | OROMUCOSAL | Status: DC | PRN
  Administered 2017-09-08: 3 mg via ORAL
  Filled 2017-09-08 (×2): qty 9

## 2017-09-08 MED ORDER — TETANUS-DIPHTH-ACELL PERTUSSIS 5-2.5-18.5 LF-MCG/0.5 IM SUSP
0.5000 mL | Freq: Once | INTRAMUSCULAR | Status: DC
  Filled 2017-09-08: qty 0.5

## 2017-09-08 MED ORDER — OXYCODONE HCL 5 MG PO TABS
5.0000 mg | ORAL_TABLET | ORAL | Status: DC | PRN
  Administered 2017-09-09: 5 mg via ORAL
  Filled 2017-09-08: qty 1

## 2017-09-08 MED ORDER — LACTATED RINGERS IV SOLN: INTRAVENOUS | Status: DC

## 2017-09-08 MED ORDER — DIBUCAINE 1 % RE OINT: 1.0000 "application " | TOPICAL_OINTMENT | RECTAL | Status: DC | PRN

## 2017-09-08 MED ORDER — INFLUENZA VAC SPLIT QUAD 0.5 ML IM SUSY
0.5000 mL | PREFILLED_SYRINGE | INTRAMUSCULAR | Status: AC
  Administered 2017-09-10: 0.5 mL via INTRAMUSCULAR
  Filled 2017-09-08: qty 0.5

## 2017-09-08 MED ORDER — OXYCODONE HCL 5 MG PO TABS: 10.0000 mg | ORAL_TABLET | ORAL | Status: DC | PRN

## 2017-09-08 MED ORDER — SENNOSIDES-DOCUSATE SODIUM 8.6-50 MG PO TABS
2.0000 | ORAL_TABLET | ORAL | Status: DC
  Administered 2017-09-09 – 2017-09-10 (×2): 2 via ORAL
  Filled 2017-09-08 (×4): qty 2

## 2017-09-08 MED ORDER — PRENATAL MULTIVITAMIN CH
1.0000 | ORAL_TABLET | Freq: Every day | ORAL | Status: DC
  Administered 2017-09-08 – 2017-09-10 (×3): 1 via ORAL
  Filled 2017-09-08 (×4): qty 1

## 2017-09-08 MED ORDER — COCONUT OIL OIL
1.0000 "application " | TOPICAL_OIL | Status: DC | PRN
  Filled 2017-09-08: qty 120

## 2017-09-08 NOTE — Lactation Note (Signed)
This note was copied from a baby's chart. Lactation Consultation Note  Patient Name: Michele Patterson [id number] Date: 09/08/2017   Infant will latch well onto L breast, but will not latch onto the R breast. The nipple on the R breast is of a slightly wider diameter & the nipple does not have as distinct a shape as the L breast. Mom has hair bilaterally on breasts, especially on the superior surface of her breasts. Mom reports + breast changes w/pregnancy. She reports that her last breast exam by an MD was when she was 3 months pregnant. No lumps palpated. There appears to be possible increased keratinization of the areola on the Mom's R breast.   I suggested that Mom continue to latch infant to the L breast & pump the R side. When Mom pumps, I suggested that she use a size 24 flange on the L breast & a size 27 on the R breast.   Hand expression was taught to Mom, but she found it uncomfortable.   Lurline Hare [facility] 09/08/2017, 5:56 PM

## 2017-09-08 NOTE — Progress Notes (Signed)
Subjective: POD# 1 Information for the patient's newborn:  Salayah, Meares [[id number]]  female   circ declined Reports feeling well, eating home food. Feeding: breast Patient reports tolerating PO.  Breast symptoms: none Pain controlled with PO meds Denies HA/SOB/C/P/N/V/dizziness. Flatus absent. She reports vaginal bleeding as normal, without clots.  Has stood at [facility], foley cath in place.  Room odor significant with BO, patient advised need to be up and shower now. Staff instructed to aid.   Objective:   VS:    Vitals:   09/08/17 0240 09/08/17 0400 09/08/17 0514 09/08/17 0900  BP: 117/61 113/68  (!) 102/58  Pulse: 86 78  80  Resp: Temp: 98.9 F (37.2 C) 98.4 F (36.9 C) 98.3 F (36.8 C) 98.4 F (36.9 C)  TempSrc: Oral Oral Oral Oral  SpO2: 95% 96% 96% 96%  Weight:      Height:         Intake/Output Summary (Last 24 hours) at 09/08/17 1029 Last data filed at 09/08/17 0900  Gross per 24 hour  Intake             2900 ml  Output             3417 ml  Net             -517 ml        Recent Labs  09/06/17 0806 09/08/17 0534  WBC 7.7 11.2*  HGB 12.8 10.8*  HCT 36.9 31.8*  PLT 182 133*     Blood type: --/--/O POS, O POS (09/28 4098)  Rubella: Immune (03/09 0000)     Physical Exam:  General: alert, cooperative and no distress CV: Regular rate and rhythm Resp: clear Abdomen: soft, nontender, normal bowel sounds Incision: serous drainage present 30% of HC dressing Uterine Fundus: firm, below umbilicus, nontender Lochia: minimal Ext: extremities normal, atraumatic, no cyanosis or edema and SCD's on      Assessment/Plan: 31 y.o.   POD# 1. [id number]                  Principal Problem:   Postpartum care following cesarean delivery 9/29 Active Problems:   Cesarean delivery delivered / Indication: failure to descend   Doing well, stable.               Advance diet as tolerated, shower now Encourage rest when baby rests Breastfeeding  support Encourage to ambulate Routine post-op care  Neta Mends, CNM, MSN 09/08/2017, 10:29 AM

## 2017-09-08 NOTE — Progress Notes (Signed)
Pt asymptomatic when out of bed- stood at bedside without dizziness or lightheadedness

## 2017-09-08 NOTE — Progress Notes (Signed)
Pt CBG 136 mb/dL on admission to PACU.  MD notified, no new orders at this time.

## 2017-09-08 NOTE — Lactation Note (Signed)
This note was copied from a baby's chart. Lactation Consultation Note New mom had c/section delivery. Very sleepy at this time. Wants to hear about BF.  Mom has large "V" shaped breast. Rt. Breast w/[place]. Reverse pressure to areola to evert nipple. Mom has long hairs around areola. Suggested to trim them so not to tickle the baby and distract him from BF. Mom agreed.  Hand expression demonstrated. No colostrum noted. Expressed no colostrum.  Rt. Nipple thick and flat. Reverse pressure and finger roll stimulation thin out. LC feels that may have difficulty latching. Gave mom shells to wear in bra.  DEBP iniated,  Mom shown how to use DEBP & how to disassemble, clean, & reassemble parts. Mom encouraged to feed baby 8-12 times/24 hours and with feeding cues.  Newborn behavior, cluster feeding, I&O, STS, supply and demand.  [facility]/LC brochure given w/resources, support groups and LC services. Patient Name: Michele Patterson [id number] Date: 09/08/2017 Reason for consult: Initial assessment   Maternal Data Has patient been taught Hand Expression?: Yes Does the patient have breastfeeding experience prior to this delivery?: No  Feeding Feeding Type: Breast Fed Length of feed: 10 min  LATCH Score       Type of Nipple: Everted at rest and after stimulation (Rt. nipple flat, will evert w/stimulation)  Comfort (Breast/Nipple): Filling, red/small blisters or bruises, mild/mod discomfort (edema Rt. flat)        Interventions Interventions: Breast feeding basics reviewed;Breast compression;DEBP;Support pillows;Breast massage;Hand express;Pre-pump if needed;Shells  Lactation Tools Discussed/Used Tools: Pump Breast pump type: Double-Electric Breast Pump Pump Review: Setup, frequency, and cleaning;Milk Storage Initiated by:: Peri Jefferson RN IBCLC Date initiated:: 09/08/17   Consult Status Consult Status: Follow-up Date: 09/08/17 Follow-up type: In-patient    Michele Patterson, Diamond Nickel 09/08/2017, 4:05 AM

## 2017-09-09 ENCOUNTER — Encounter (HOSPITAL_COMMUNITY): Payer: Self-pay

## 2017-09-09 DIAGNOSIS — O24410 Gestational diabetes mellitus in pregnancy, diet controlled: Secondary | ICD-10-CM | POA: Diagnosis present

## 2017-09-09 DIAGNOSIS — D62 Acute posthemorrhagic anemia: Secondary | ICD-10-CM

## 2017-09-09 LAB — GLUCOSE, CAPILLARY: GLUCOSE-CAPILLARY: 136 mg/dL — AB (ref 65–99)

## 2017-09-09 NOTE — Lactation Note (Signed)
This note was copied from a baby's chart. Lactation Consultation Note  Patient Name: Michele Patterson [id number] Date: 09/09/2017 Reason for consult: Follow-up assessment;1st time breastfeeding   Follow up with mom of 40 hour old infant. Infant with 10 BF for 10-30 minutes, 5 BF attempts, 2 voids, 5 stools in the last 24 hours. Infant weight 6 lb 11.4 oz with 6% weight loss since birth. LATCH scores 6-9.   Mom had infant near breast and he had just come off. She then relatched him to the left breast in the football hold. He fed for about 5 minutes and parents took him off as her back was hurting, infant fell asleep. Infant was noted to have some swallows. Enc mom to massage/compress breast with feeding. Mom reports infant is feeding off both breasts. Enc her to alternate breasts to start on. Enc mom to feed infant STS to assist with keeping him awake. Mom did well with positioning and holding infant with feeding.   Mom has been pumping and is not obtaining colostrum. Worked with mom on hand expression and she was able to return demo, no colostrum was obtained. Mom reports she is feeling fuller today, especially on the right breast.   Enc mom to feed infant STS 8-12 x in 24 hours at first feeding cues offering both breasts with each feeding. Enc mom to follow BF with pumping x 5 and minutes and then hand expression. Discussed that if colostrum is obtained it needs to be fed to infant. Enc mom to call out for feeding assistance as needed.     Maternal Data Formula Feeding for Exclusion: No Has patient been taught Hand Expression?: Yes Does the patient have breastfeeding experience prior to this delivery?: No  Feeding Feeding Type: Breast Fed Length of feed: 5 min  LATCH Score Latch: Repeated attempts needed to sustain latch, nipple held in mouth throughout feeding, stimulation needed to elicit sucking reflex.  Audible Swallowing: A few with stimulation  Type of Nipple: Everted at  rest and after stimulation  Comfort (Breast/Nipple): Soft / non-tender  Hold (Positioning): Assistance needed to correctly position infant at breast and maintain latch.  LATCH Score: 7  Interventions Interventions: Breast feeding basics reviewed;Support pillows;Assisted with latch;Skin to skin  Lactation Tools Discussed/Used Tools: Pump Breast pump type: Double-Electric Breast Pump Pump Review: Setup, frequency, and cleaning Initiated by:: Reviewed and encouraged every 3 hours   Consult Status Consult Status: Follow-up Date: 09/10/17 Follow-up type: In-patient    Silas Flood Atsushi Yom 09/09/2017, 4:07 PM

## 2017-09-09 NOTE — Anesthesia Postprocedure Evaluation (Signed)
Anesthesia Post Note  Patient: Michele Patterson  Procedure(s) Performed: CESAREAN SECTION (N/A )     Patient location during evaluation: PACU Anesthesia Type: Epidural Level of consciousness: awake and alert Pain management: pain level controlled Vital Signs Assessment: post-procedure vital signs reviewed and stable Respiratory status: spontaneous breathing and respiratory function stable Cardiovascular status: blood pressure returned to baseline and stable Postop Assessment: no headache, no backache and spinal receding Anesthetic complications: no    Last Vitals:  Vitals:   09/08/17 2350 09/09/17 0514  BP: 105/65 103/60  Pulse: 81 68  Resp: 16 18  Temp: 36.4 C 36.5 C  SpO2:      Last Pain:  Vitals:   09/09/17 1330  TempSrc:   PainSc: 3    Pain Goal:                 Phillips Grout

## 2017-09-09 NOTE — Progress Notes (Signed)
POSTOPERATIVE DAY # 2 S/P Primary LTCS for arrest of dilation, chorioamnionitis, baby boy    S:         Reports feeling better today, having some incisional soreness when standing             Tolerating po intake / no nausea / no vomiting / + flatus / no BM  Denies dizziness, SOB, or CP             Bleeding is light             Pain controlled with Motrin             Up ad lib / ambulatory/ voiding QS  Newborn breast feeding - reports difficulty with latching and requesting assistance from lactation  / Circumcision - declined    O:  VS: BP 103/60 (BP Location: Left Arm)   Pulse 68   Temp 97.7 F (36.5 C) (Oral)   Resp 18   Ht  (1.575 m)   Wt 68.9 kg (152 lb)   LMP 11/30/2016   SpO2 97%   Breastfeeding? Unknown   BMI 27.80 kg/m    LABS:               Recent Labs  09/08/17 0534  WBC 11.2*  HGB 10.8*  PLT 133*               Bloodtype: --/--/O POS, O POS (09/28 1610)  Rubella: Immune (03/09 0000)                                             I&O: Intake/Output      09/30 0701 - 10/01 0700 10/01 0701 - 10/02 0700   Urine (mL/kg/hr) 3600 (2.2)    Total Output 3600     Net -3600                       Physical Exam:             Alert and Oriented X3  Lungs: Clear and unlabored  Heart: regular rate and rhythm / no mumurs  Abdomen: soft, non-tender, non-distended, active bowel sounds in all quadrants             Fundus: firm, non-tender, U-2             Dressing: honeycomb dsg with steristrips c/d/i              Incision:  approximated with sutures / no erythema / no ecchymosis / no drainage  Perineum: intact  Lochia: appropriate no clots  Extremities: trace pedal edema, no calf pain or tenderness  A:        POD # 2 S/P Primary LTCS for arrest of dilation, chorioamnionitis            ABL Anemia - stable, delivered  A1GDM - delivered, CBGs stable  P:        Routine postoperative care              See lactation today  Encouraged ambulation today    Anticipate  discharge home tomorrow   Carlean Jews, MSN, CNM [facility]

## 2017-09-10 MED ORDER — OXYCODONE HCL 5 MG PO TABS
5.0000 mg | ORAL_TABLET | ORAL | 0 refills | Status: DC | PRN
Start: 2017-09-10 — End: 2017-11-28

## 2017-09-10 MED ORDER — IBUPROFEN 600 MG PO TABS: 600.0000 mg | ORAL_TABLET | Freq: Four times a day (QID) | ORAL | 0 refills | Status: DC

## 2017-09-10 MED ORDER — DOCUSATE SODIUM 100 MG PO CAPS: 100.0000 mg | ORAL_CAPSULE | Freq: Two times a day (BID) | ORAL | 2 refills | Status: DC

## 2017-09-10 NOTE — Progress Notes (Signed)
POSTOPERATIVE DAY # 3 S/P Primary LTCS for arrest of dilation, chorioamnionitis, baby boy    S:         Reports feeling well, some incisional pain with standing              Tolerating po intake / no nausea / no vomiting / + flatus / + BM x 1 today  Denies dizziness, SOB, or CP             Bleeding is moderate             Pain controlled with Motrin and Oxycodone              Up ad lib / ambulatory/ voiding QS  Newborn breast feeding with formula supplementation - has appt with outpatient lactation on Thursday  / Circumcision - declined    O:  VS: BP 121/64 (BP Location: Left Arm)   Pulse 74   Temp 98.1 F (36.7 C) (Oral)   Resp 18   Ht  (1.575 m)   Wt 68.9 kg (152 lb)   LMP 11/30/2016   SpO2 97%   Breastfeeding? Unknown   BMI 27.80 kg/m    LABS:               Recent Labs  09/08/17 0534  WBC 11.2*  HGB 10.8*  PLT 133*               Bloodtype: --/--/O POS, O POS (09/28 0806)  Rubella: Immune                                               Physical Exam:             Alert and Oriented X3  Lungs: Clear and unlabored  Heart: regular rate and rhythm / no murmurs  Abdomen: soft, non-tender, non-distended, active bowel sounds in all quadrants             Fundus: firm, non-tender, U-2             Dressing: honeycomb dsg with steri-strips c/d/i              Incision:  approximated with sutures / no erythema / no ecchymosis / no drainage  Perineum: intact  Lochia: appropriate, no clots  Extremities: no edema, no calf pain or tenderness  A:        POD # 2 S/P Primary LTCS for arrest of dilation, chorioamnionitis            ABL Anemia - stable, delivered            A1GDM - delivered, CBGs stable   P:        Routine postoperative care              Discharge home today  WOB discharge book and instructions given  May continue OTC iron supplement daily  F/u with Dr. Juliene Pina in 6 weeks for PP visit and 2 hour GTT   Recommend smart start nurse in 1 week for interval f/u  with breastfeeding/support  Michele Jews, MSN, CNM [facility]

## 2017-09-10 NOTE — Progress Notes (Signed)
CSW received consult due to score greater than 9, or positive for SI on Edinburgh Depression Screen.    MOB had a score of a 10 on EDPS.  When CSW arrived, MOB was resting in bed, infant was asleep in bassinet, and FOB/Husband was sitting on the couch.  MOB gave CSW permission to meet with MOB while FOB was present.  CSW explained CSW's role an encouraged MOB to ask questions.  CSW explained EDPS assessment and concerns regarding MOB's score.  MOB communicated that MOB did not read the intructions and feels like MOB's results are not accurate.    CSW provided education regarding Baby Blues vs PMADs and provided MOB with information about support groups held at [facility].  CSW encouraged MOB to evaluate her mental health throughout the postpartum period with the use of the New Mom Checklist developed by Postpartum Progress and notify a medical professional if symptoms arise.  CSW assessed for safety and MOB denied SI and HI.  MOB appeared to have insight and awareness about MOB's MH and did not present with any acute symptoms.   FOB was attentive to MOB and emotionally demonstrated support while CSW was meeting with MOB.  The family identified supporters and expressed they feel comfortable parenting.   There are no barriers to d/c.  Blaine Hamper, MSW, LCSW Clinical Social Work [phone]

## 2017-09-10 NOTE — Discharge Summary (Signed)
Obstetric Discharge Summary   Patient Name: Michele Patterson DOB: [birth year] MRN: [id number]  Date of Admission: 09/06/2017 Date of Discharge: 09/10/2017 Date of Delivery: 09/07/17 Gestational Age at Delivery: [redacted]w[redacted]d  Primary OB: Michele Patterson - Dr. Juliene Patterson  Antepartum complications:  - PCOS - Femara/ HMG/ IUI conception at [facility] - Hx. Of SAB - A1GDM - Anxiety r/t losing parents to medical illness  Prenatal Labs:  ABO, Rh:  O(+) Antibody:  Negative  Rubella: Immune (03/09 0000) RPR: Nonreactive (03/09 0000)  HBsAg: Negative (03/09 0000)  HIV: Non-reactive (03/09 0000)  GBS: Negative (08/29 0000)  Admitting Diagnosis: IOL at term for A1GDM   Secondary Diagnoses: Patient Active Problem List   Diagnosis Date Noted  . GDM, class A1 09/09/2017  . Acute blood loss anemia 09/09/2017  . Cesarean delivery delivered / Indication: failure to descend 09/08/2017  . Postpartum care following cesarean delivery 9/29 09/08/2017  . Annual physical exam 11/30/2015  . Left knee pain 11/30/2015  . Family planning 10/25/2014  . PCOS (polycystic ovarian syndrome) 10/25/2014    Augmentation: Cytotec, Pitocin   Date of Delivery: 09/07/17 Delivered By: Dr. Mody/ Michele Patterson, CNM assist Delivery Type: primary cesarean section, low transverse incision   Newborn Data: Live born female  Birth Weight: 7 lb 2.8 oz (3255 g) APGAR: 9, 9  Postpartum Course  (Cesarean Section):  Pt. Was induced for A1GDM, and had arrest of dilatation at 6cm resulting in LTCS.  Patient had an uncomplicated postpartum course.  By time of discharge on POD#3, her pain was controlled on oral pain medications; she had appropriate lochia and was ambulating, voiding without difficulty, tolerating regular diet and passing flatus.   She was deemed stable for discharge to home.     Labs: CBC Latest Ref Rng & Units 09/08/2017 09/06/2017 12/07/2016  WBC 4.0 - 10.5 K/uL 11.2(H) 7.7 5.7  Hemoglobin 12.0 - 15.0 g/dL 10.8(L)  12.8 13.3  Hematocrit 36.0 - 46.0 % 31.8(L) 36.9 40.3  Platelets 150 - 400 K/uL 133(L) 182 399   O POS  Physical exam:  BP 121/64 (BP Location: Left Arm)   Pulse 74   Temp 98.1 F (36.7 C) (Oral)   Resp 18   Ht  (1.575 m)   Wt 68.9 kg (152 lb)   LMP 11/30/2016   SpO2 97%   Breastfeeding? Unknown   BMI 27.80 kg/m  General: alert and no distress Pulm: normal respiratory effort Lochia: appropriate Abdomen: soft, NT Uterine Fundus: firm, below umbilicus Perineum: healing well, no significant erythema, no significant edema Incision: c/d/i, healing well, no significant drainage, no dehiscence, no significant erythema Extremities: No evidence of DVT seen on physical exam. No lower extremity edema.  Disposition: stable, discharge to home Baby Feeding: breast milk and formula Baby Disposition: home with mom  Contraception: unsure  Rh Immune globulin given: N/A Rubella vaccine given: N/A Tdap vaccine given in AP or PP setting: 06/20/17  Flu vaccine given in AP or PP setting: given 09/10/17   Plan:  Michele Patterson was discharged to home in good condition. Follow-up appointment at Michele Patterson in 6 weeks.  Discharge Instructions: Per After Visit Summary. Activity: Advance as tolerated. Pelvic rest for 6 weeks.  Refer to After Visit Summary Diet: Regular, Heart Healthy Discharge Medications: Allergies as of 09/10/2017   No Known Allergies     Medication List    STOP taking these medications   calcium carbonate 500 MG chewable tablet Commonly known as:  TUMS - dosed in  mg elemental calcium     TAKE these medications   docusate sodium 100 MG capsule Commonly known as:  COLACE Take 1 capsule (100 mg total) by mouth 2 (two) times daily.   ibuprofen 600 MG tablet Commonly known as:  ADVIL,MOTRIN Take 1 tablet (600 mg total) by mouth every 6 (six) hours.   IRON PO Take 1 tablet by mouth daily.   oxyCODONE 5 MG immediate release tablet Commonly known  as:  Oxy IR/ROXICODONE Take 1 tablet (5 mg total) by mouth every 4 (four) hours as needed (pain scale 4-7).   prenatal multivitamin Tabs tablet Take 1 tablet by mouth daily at 12 noon.      Outpatient follow up:  Follow-up Information    Michele Evans, MD. Schedule an appointment as soon as possible for a visit in 6 week(s).   Specialty:  Obstetrics and Gynecology Why:  Postpartum visit and 2 hour GTT Michele Patterson nurse visit in 1 week  Contact information: [address] [place] [address] [phone]           Signed:  Carlean Jews, MSN, CNM [facility]

## 2017-09-10 NOTE — Lactation Note (Signed)
This note was copied from a baby's chart. Lactation Consultation Note Mom supplementing w/formula after BF.  Had 9% weight loss at 54 hrs old Patient Name: Michele Patterson [id number] Date: 09/10/2017     Maternal Data    Feeding Feeding Type: Breast Fed Length of feed: 20 min  LATCH Score                   Interventions    Lactation Tools Discussed/Used     Consult Status      Michele Patterson G 09/10/2017, 5:54 AM

## 2017-09-10 NOTE — Lactation Note (Signed)
This note was copied from a baby's chart. Lactation Consultation Note  Patient Name: Michele Patterson [id number] Date: 09/10/2017 Reason for consult: Follow-up assessment;Difficult latch;Mother's request   Follow up visit at 58 hours of age.  Mom has PCOS with difficulty to conceive and needed AUI.  Mom reports breast changes during pregnancy with sparse dark hair noted on breasts.  Breasts are large and soft with nipples pointed midline.  Right nipple less compressible with tough areola.  Mom is using shells with some improvement.  Baby showing feeding cues.  LC and shadow French Ana) assisting with latch.  Baby latched well with wide gape and few sucks baby stops and is sleepy.  Mom reports as worse since adding formula in bottles.   LC worked with mom for several minutes alternating breasts to get 2 small pinpoint drops of colostrum expressed. Mom has been post pumping most feedings.  LC encouraged mom to add hand expression after feedings. LC advised mom to increase amount of supplement as baby will tolerate.  Paced bottle  feedings discussed.   Parents have Dr. Manson Passey nipples for home use.  Mom reports she has follow up with peds LC on Thursday 09/12/17 at 10:15.   Discussed milk transitioning to larger volume, engorgement care discussed.  Encouraged frequent feedings. Mom to soften breast as needed prior to latch.    Maternal Data    Feeding Feeding Type: Breast Fed Length of feed:  (few minutes)  LATCH Score Latch: Repeated attempts needed to sustain latch, nipple held in mouth throughout feeding, stimulation needed to elicit sucking reflex.  Audible Swallowing: None  Type of Nipple: Everted at rest and after stimulation  Comfort (Breast/Nipple): Soft / non-tender  Hold (Positioning): Assistance needed to correctly position infant at breast and maintain latch.  LATCH Score: 6  Interventions Interventions: Breast feeding basics reviewed;Assisted with latch;Skin to skin;Breast  massage;Hand express;Pre-pump if needed;Breast compression;Adjust position;Support pillows;Position options;DEBP  Lactation Tools Discussed/Used Tools: Shells;Pump Breast pump type: Double-Electric Breast Pump   Consult Status Consult Status: Follow-up Date: 09/12/17 Follow-up type: Out-patient    Franz Dell 09/10/2017, 9:33 AM

## 2017-11-07 ENCOUNTER — Ambulatory Visit (INDEPENDENT_AMBULATORY_CARE_PROVIDER_SITE_OTHER): Payer: 59 | Admitting: Sports Medicine

## 2017-11-07 ENCOUNTER — Encounter: Payer: Self-pay | Admitting: Sports Medicine

## 2017-11-07 DIAGNOSIS — F4321 Adjustment disorder with depressed mood: Secondary | ICD-10-CM | POA: Diagnosis not present

## 2017-11-07 DIAGNOSIS — Z0000 Encounter for general adult medical examination without abnormal findings: Secondary | ICD-10-CM | POA: Diagnosis not present

## 2017-11-07 DIAGNOSIS — G44209 Tension-type headache, unspecified, not intractable: Secondary | ICD-10-CM | POA: Diagnosis not present

## 2017-11-07 NOTE — Progress Notes (Signed)
  Subjective:    CC: Headache  HPI: This is a pleasant 31 year old female, she is 2 months postpartum, pregnancy was with assisted reproductive technologies, gestation was complicated by gestational diabetes, she had a cesarean section.  Over the past couple of weeks she has had the increased stress of the anniversary of her mother's death, the new baby, and some fighting with her husband.  She is developed intermittent headaches on the very top of her head without radiation, no photophobia, phonophobia, nausea, no visual changes, no focal neurologic symptoms, no head trauma, no constitutional symptoms.  These headaches last for a few minutes to an hour in the evenings, and resolve on their own.  Past medical history:  Negative.  See flowsheet/record as well for more information.  Surgical history: Negative.  See flowsheet/record as well for more information.  Family history: Negative.  See flowsheet/record as well for more information.  Social history: Negative.  See flowsheet/record as well for more information.  Allergies, and medications have been entered into the medical record, reviewed, and no changes needed.   Review of Systems: No fevers, chills, night sweats, weight loss, chest pain, or shortness of breath.   Objective:    General: Well Developed, well nourished, and in no acute distress.  Neuro: Alert and oriented x3, extra-ocular muscles intact, sensation grossly intact.  Cranial nerves II through XII are intact, motor, sensory, coordinative functions are all intact, negative Romberg sign, no finger dysmetria, no dysdiadochokinesis. HEENT: Normocephalic, atraumatic, pupils equal round reactive to light, neck supple, no masses, no lymphadenopathy, thyroid nonpalpable.  Skin: Warm and dry, no rashes. Cardiac: Regular rate and rhythm, no murmurs rubs or gallops, no lower extremity edema.  Respiratory: Clear to auscultation bilaterally. Not using accessory muscles, speaking in full  sentences.  Impression and Recommendations:    Grieving I think she is having some somatization of her stress, as well as grieving, coming up on the anniversary of her mother's passing. They do show great insight, and are agreeable to proceed with behavioral therapy. Referral placed. Return in a month to discuss this and to do another PHQ/GAD  Tension headache New onset headaches centered around increased life stressors including the death of her mother, birth of her child 2 months ago. Neurologic exam is normal. She can do Tylenol as needed, relaxation techniques, I would like to discuss this again in a month and if still having headaches or worsening we will go ahead and knockout the brain MRI.  ___________________________________________ Ihor Austinhomas J. Benjamin Stainhekkekandam, M.D., ABFM., CAQSM. Primary Care and Sports Medicine  [facility]  Adjunct Instructor of Family Medicine  [employer]

## 2017-11-07 NOTE — Assessment & Plan Note (Signed)
New onset headaches centered around increased life stressors including the death of her mother, birth of her child 2 months ago. Neurologic exam is normal. She can do Tylenol as needed, relaxation techniques, I would like to discuss this again in a month and if still having headaches or worsening we will go ahead and knockout the brain MRI.

## 2017-11-07 NOTE — Assessment & Plan Note (Signed)
I think she is having some somatization of her stress, as well as grieving, coming up on the anniversary of her mother's passing. They do show great insight, and are agreeable to proceed with behavioral therapy. Referral placed. Return in a month to discuss this and to do another PHQ/GAD

## 2017-11-07 NOTE — Patient Instructions (Signed)

## 2017-11-26 DIAGNOSIS — Z8632 Personal history of gestational diabetes: Secondary | ICD-10-CM | POA: Diagnosis not present

## 2017-11-27 ENCOUNTER — Encounter (HOSPITAL_COMMUNITY): Payer: Self-pay | Admitting: Obstetrics & Gynecology

## 2017-11-28 ENCOUNTER — Encounter: Payer: Self-pay | Admitting: Sports Medicine

## 2017-11-28 ENCOUNTER — Ambulatory Visit (INDEPENDENT_AMBULATORY_CARE_PROVIDER_SITE_OTHER): Payer: 59 | Admitting: Sports Medicine

## 2017-11-28 DIAGNOSIS — F4321 Adjustment disorder with depressed mood: Secondary | ICD-10-CM | POA: Diagnosis not present

## 2017-11-28 DIAGNOSIS — G44209 Tension-type headache, unspecified, not intractable: Secondary | ICD-10-CM

## 2017-11-28 NOTE — Progress Notes (Signed)
  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  We discussed behavioral therapy, they did not proceed with this but over time her symptoms resolved and they are happy with how things are going now.  Past medical history:  Negative.  See flowsheet/record as well for more information.  Surgical history: Negative.  See flowsheet/record as well for more information.  Family history: Negative.  See flowsheet/record as well for more information.  Social history: Negative.  See flowsheet/record as well for more information.  Allergies, and medications have been entered into the medical record, reviewed, and no changes needed.   (To billers/coders, pertinent past medical, social, surgical, family history can be found in problem list, if problem list is marked as reviewed then this indicates that past medical, social, surgical, family history was also reviewed)  Review of Systems: No fevers, chills, night sweats, weight loss, chest pain, or shortness of breath.   Objective:    General: Well Developed, well nourished, and in no acute distress.  Neuro: Alert and oriented x3, extra-ocular muscles intact, sensation grossly intact.  HEENT: Normocephalic, atraumatic, pupils equal round reactive to light, neck supple, no masses, no lymphadenopathy, thyroid nonpalpable.  Skin: Warm and dry, no rashes. Cardiac: Regular rate and rhythm, no murmurs rubs or gallops, no lower extremity edema.  Respiratory: Clear to auscultation bilaterally. Not using accessory muscles, speaking in full sentences.  Impression and Recommendations:    Grieving Improved considerably with the passing of time. This was more an anniversary syndrome. Never did counseling, did not needed.  Tension headache Resolved with the passage of  stress. ___________________________________________ Ihor Austinhomas J. Benjamin Stainhekkekandam, M.D., ABFM., CAQSM. Primary Care and Sports Medicine [place] [facility]  Adjunct Instructor of Family Medicine  [employer]

## 2017-11-28 NOTE — Assessment & Plan Note (Signed)
Resolved with the passage of stress.

## 2017-11-28 NOTE — Assessment & Plan Note (Signed)
Improved considerably with the passing of time. This was more an anniversary syndrome. Never did counseling, did not needed.

## 2017-12-04 DIAGNOSIS — E739 Lactose intolerance, unspecified: Secondary | ICD-10-CM | POA: Diagnosis not present

## 2017-12-17 ENCOUNTER — Encounter: Payer: 59 | Admitting: Sports Medicine

## 2017-12-20 ENCOUNTER — Encounter: Payer: Self-pay | Admitting: Osteopathic Medicine

## 2017-12-20 ENCOUNTER — Ambulatory Visit (INDEPENDENT_AMBULATORY_CARE_PROVIDER_SITE_OTHER): Payer: 59 | Admitting: Osteopathic Medicine

## 2017-12-20 VITALS — BP 104/77 | HR 100 | Temp 98.1°F | Wt 145.1 lb

## 2017-12-20 DIAGNOSIS — B9789 Other viral agents as the cause of diseases classified elsewhere: Secondary | ICD-10-CM | POA: Diagnosis not present

## 2017-12-20 DIAGNOSIS — J069 Acute upper respiratory infection, unspecified: Secondary | ICD-10-CM | POA: Diagnosis not present

## 2017-12-20 DIAGNOSIS — R509 Fever, unspecified: Secondary | ICD-10-CM | POA: Diagnosis not present

## 2017-12-20 LAB — POCT INFLUENZA A/B
INFLUENZA B, POC: NEGATIVE
Influenza A, POC: NEGATIVE

## 2017-12-20 NOTE — Progress Notes (Signed)
HPI: Michele Patterson is a 32 y.o. female who  has a past medical history of Anemia, Diabetes mellitus without complication (HCC), Gestational diabetes, Irregular periods/menstrual cycles, PCOS (polycystic ovarian syndrome), and Runner's knee.  she presents to [facility] today, 12/20/17,  for chief complaint of:  Chief Complaint  Patient presents with  . Fever    Cold symptoms for the past 3 or 4 days. Some sinus congestion/sneezing. Most concerning to her is fever 101 last night. She took some Motrin and felt okay. No fever this morning. Worried about young infant, so just wanted to come in and get checked.  Past medical, surgical, social and family history reviewed:  Patient Active Problem List   Diagnosis Date Noted  . Grieving 11/07/2017  . Tension headache 11/07/2017  . GDM, class A1 09/09/2017  . Acute blood loss anemia 09/09/2017  . Cesarean delivery delivered / Indication: failure to descend 09/08/2017  . Postpartum care following cesarean delivery 9/29 09/08/2017  . Annual physical exam 11/30/2015  . Left knee pain 11/30/2015  . Family planning 10/25/2014  . PCOS (polycystic ovarian syndrome) 10/25/2014    Past Surgical History:  Procedure Laterality Date  . CESAREAN SECTION N/A 09/07/2017   Procedure: CESAREAN SECTION;  Surgeon: Shea Evans, MD;  Location: [facility] BIRTHING SUIT[facility];  Service: Obstetrics;  Laterality: N/A;    Social History   Tobacco Use  . Smoking status: Never Smoker  . Smokeless tobacco: Never Used  Substance Use Topics  . Alcohol use: No    Family History  Problem Relation Age of Onset  . Heart attack Father   . Diabetes Father   . Cancer Mother 15       breast  . Diabetes Paternal Grandfather      Current medication list and allergy/intolerance information reviewed:    Current Outpatient Medications  Medication Sig Dispense Refill  . Vitamin D, Ergocalciferol, (DRISDOL) 50000 units CAPS capsule  Take by mouth.    Marland Kitchen ibuprofen (ADVIL,MOTRIN) 600 MG tablet TK 1 T PO Q 6 H PRN  0  . ONETOUCH DELICA LANCETS FINE MISC U TO TEST QID UTD  2  . ONETOUCH VERIO test strip TEST QID UTD  2  . Prenatal Vit-Fe Fumarate-FA (PRENATAL MULTIVITAMIN) TABS tablet Take 1 tablet by mouth daily at 12 noon.     No current facility-administered medications for this visit.     No Known Allergies    Review of Systems:  Constitutional:  +fever, +chills, +recent illness, No unintentional weight changes. +significant fatigue.   HEENT: No  headache, no vision change, no hearing change, No sore throat, No  sinus pressure  Cardiac: No  chest pain, No  pressure, No palpitations, No  Orthopnea  Respiratory:  No  shortness of breath. No  Cough  Gastrointestinal: No  abdominal pain, No  nausea, No  vomiting,  No  blood in stool, No  diarrhea, No  constipation   Musculoskeletal: +new myalgia/arthralgia described as just generalized body aches  Skin: No  Rash   Exam:  BP 104/77   Pulse 100   Temp 98.1 F (36.7 C) (Oral)   Wt 145 lb 1.9 oz (65.8 kg)   BMI 26.54 kg/m   Constitutional: VS see above. General Appearance: alert, well-developed, well-nourished, NAD  Eyes: Normal lids and conjunctive, non-icteric sclera  Ears, Nose, Mouth, Throat: MMM, Normal external inspection ears/nares/mouth/lips/gums. TM normal bilaterally. Pharynx/tonsils no erythema, no exudate. Nasal mucosa normal.   Neck: No masses, trachea midline.  No thyroid enlargement. No tenderness/mass appreciated. No lymphadenopathy  Respiratory: Normal respiratory effort. no wheeze, no rhonchi, no rales  Cardiovascular: S1/S2 normal, no murmur, no rub/gallop auscultated. RRR. No lower extremity edema.   Psychiatric: Normal judgment/insight. Normal mood and affect. Oriented x3.    Results for orders placed or performed in visit on 12/20/17 (from the past 72 hour(s))  POCT Influenza A/B     Status: None   Collection Time: 12/20/17  11:16 AM  Result Value Ref Range   Influenza A, POC Negative Negative   Influenza B, POC Negative Negative     ASSESSMENT/PLAN:   Fever, unspecified fever cause - Plan: POCT Influenza A/B  Viral URI with cough - Supportive care with over-the-counter medications as noted below.    Patient Instructions  There is no perfect data on what is safe for breastfeeding moms to take. This is because of the limited studies we can do for breastfeeding moms and infants. If you notice any problems with your baby which you think might be due to a medicine you are taking, contact your doctor, your baby's doctor, or your pharmacist. The following medications are likely fine for you to take for viral illness:  Acetaminophen (Tylenol) 500 mg tablets - take max 2 tablets (1000 mg) every 6 hours (4 times per day)  Ibuprofen (Motrin) 200 mg tablets - take max 4 tablets (800 mg) every 6 hours Nasal Saline if desired to rinse the nasal passages Phenylephrine (Sudafed) may reduce breast milk production and is not advised  Diphenhydramine (Benadryl) 25 mg tablets - lower doses in breastfeeding, take max 1 tablets every 4 hours Oral Lidocaine prescriptions for sore throat Dextromethorphan (Robitussin, others) - cough suppressant is fine Guaifenesin (Robitussin, Mucinex, others) - expectorant (helps cough up mucus) is fine (Dextromethorphan and Guaifenesin also come in a combination tablet/syrup) Lozenges w/ Benzocaine + Menthol (Cepacol) Honey - as much as you want! Teas which "coat the throat" - look for ingredients Elm Bark, Licorice Root, Marshmallow Root Don't waste your money on Vitamin C or Echinacea     Visit summary with medication list and pertinent instructions was printed for patient to review. All questions at time of visit were answered - patient instructed to contact office with any additional concerns. ER/RTC precautions were reviewed with the patient.   Follow-up plan: Return if symptoms  worsen or fail to improve.  Note: Total time spent 25 minutes, greater than 50% of the visit was spent face-to-face counseling and coordinating care for the following: The primary encounter diagnosis was Fever, unspecified fever cause. A diagnosis of Viral URI with cough was also pertinent to this visit.Marland Kitchen.  Please note: voice recognition software was used to produce this document, and typos may escape review. Please contact Dr. Lyn HollingsheadAlexander for any needed clarifications.

## 2017-12-20 NOTE — Patient Instructions (Signed)
There is no perfect data on what is safe for breastfeeding moms to take. This is because of the limited studies we can do for breastfeeding moms and infants. If you notice any problems with your baby which you think might be due to a medicine you are taking, contact your doctor, your baby's doctor, or your pharmacist. The following medications are likely fine for you to take for viral illness:  Acetaminophen (Tylenol) 500 mg tablets - take max 2 tablets (1000 mg) every 6 hours (4 times per day)  Ibuprofen (Motrin) 200 mg tablets - take max 4 tablets (800 mg) every 6 hours Nasal Saline if desired to rinse the nasal passages Phenylephrine (Sudafed) may reduce breast milk production and is not advised  Diphenhydramine (Benadryl) 25 mg tablets - lower doses in breastfeeding, take max 1 tablets every 4 hours Oral Lidocaine prescriptions for sore throat Dextromethorphan (Robitussin, others) - cough suppressant is fine Guaifenesin (Robitussin, Mucinex, others) - expectorant (helps cough up mucus) is fine (Dextromethorphan and Guaifenesin also come in a combination tablet/syrup) Lozenges w/ Benzocaine + Menthol (Cepacol) Honey - as much as you want! Teas which "coat the throat" - look for ingredients Elm Bark, Licorice Root, Marshmallow Root Don't waste your money on Vitamin C or Echinacea

## 2017-12-31 ENCOUNTER — Ambulatory Visit (INDEPENDENT_AMBULATORY_CARE_PROVIDER_SITE_OTHER): Payer: 59 | Admitting: Sports Medicine

## 2017-12-31 ENCOUNTER — Encounter: Payer: Self-pay | Admitting: Sports Medicine

## 2017-12-31 VITALS — BP 122/87 | HR 69 | Wt 147.0 lb

## 2017-12-31 DIAGNOSIS — R52 Pain, unspecified: Secondary | ICD-10-CM

## 2017-12-31 DIAGNOSIS — M545 Low back pain, unspecified: Secondary | ICD-10-CM | POA: Insufficient documentation

## 2017-12-31 DIAGNOSIS — Z0000 Encounter for general adult medical examination without abnormal findings: Secondary | ICD-10-CM | POA: Diagnosis not present

## 2017-12-31 NOTE — Assessment & Plan Note (Signed)
Benign exam, no red flags. No x-rays needed, patient may use Tylenol, avoiding NSAIDs due to breast-feeding for now. Rehab exercises given, do this for 6 weeks before considering imaging.

## 2017-12-31 NOTE — Progress Notes (Signed)
Subjective:    CC: Annual physical exam  HPI:  This is a pleasant 32 year old female, she is recently postpartum, did have some gestational diabetes.  Overall she is doing well, has some discomfort associated with her C-section scar.  She also has some new onset low back pain, present for the past several weeks, axial, worse with sitting, flexion, Valsalva but nothing radicular, no bowel or bladder dysfunction, saddle numbness, no constitutional symptoms, no trauma.  I reviewed the past medical history, family history, social history, surgical history, and allergies today and no changes were needed.  Please see the problem list section below in epic for further details.  Past Medical History: Past Medical History:  Diagnosis Date  . Anemia   . Diabetes mellitus without complication (HCC)   . Gestational diabetes   . Irregular periods/menstrual cycles   . PCOS (polycystic ovarian syndrome)   . Runner's knee    Past Surgical History: Past Surgical History:  Procedure Laterality Date  . CESAREAN SECTION N/A 09/07/2017   Procedure: CESAREAN SECTION;  Surgeon: Shea EvansMody, Vaishali, MD;  [facility];  Service: Obstetrics;  Laterality: N/A;   Social History: Social History   Socioeconomic History  . Marital status: Married    Spouse name: None  . Number of children: None  . Years of education: None  . Highest education level: None  Social Needs  . Financial resource strain: None  . Food insecurity - worry: None  . Food insecurity - inability: None  . Transportation needs - medical: None  . Transportation needs - non-medical: None  Occupational History  . None  Tobacco Use  . Smoking status: Never Smoker  . Smokeless tobacco: Never Used  Substance and Sexual Activity  . Alcohol use: No  . Drug use: No  . Sexual activity: Yes  Other Topics Concern  . None  Social History Narrative  . None   Family History: Family History  Problem Relation Age of Onset  .  Heart attack Father   . Diabetes Father   . Cancer Mother [id number]       breast  . Diabetes Paternal Grandfather    Allergies: No Known Allergies Medications: See med rec.  Review of Systems: No headache, visual changes, nausea, vomiting, diarrhea, constipation, dizziness, abdominal pain, skin rash, fevers, chills, night sweats, swollen lymph nodes, weight loss, chest pain, body aches, joint swelling, muscle aches, shortness of breath, mood changes, visual or auditory hallucinations.  Objective:    General: Well Developed, well nourished, and in no acute distress.  Neuro: Alert and oriented x3, extra-ocular muscles intact, sensation grossly intact. Cranial nerves II through XII are intact, motor, sensory, and coordinative functions are all intact. HEENT: Normocephalic, atraumatic, pupils equal round reactive to light, neck supple, no masses, no lymphadenopathy, thyroid nonpalpable. Oropharynx, nasopharynx, external ear canals are unremarkable. Skin: Warm and dry, no rashes noted.  Cardiac: Regular rate and rhythm, no murmurs rubs or gallops.  Respiratory: Clear to auscultation bilaterally. Not using accessory muscles, speaking in full sentences.  Abdominal: Soft, nontender, nondistended, positive bowel sounds, no masses, no organomegaly.  Back Exam:  Inspection: Unremarkable  Motion: Flexion 45 deg, Extension 45 deg, Side Bending to 45 deg bilaterally,  Rotation to 45 deg bilaterally  SLR laying: Negative  XSLR laying: Negative  Palpable tenderness: None. FABER: negative. Sensory change: Gross sensation intact to all lumbar and sacral dermatomes.  Reflexes: 2+ at both patellar tendons, 2+ at achilles tendons, Babinski's downgoing.  Strength at foot  Plantar-flexion: 5/5 Dorsi-flexion: 5/5 Eversion: 5/5 Inversion: 5/5  Leg strength  Quad: 5/5 Hamstring: 5/5 Hip flexor: 5/5 Hip abductors: 5/5  Gait unremarkable.  Impression and Recommendations:    The patient was counselled, risk  factors were discussed, anticipatory guidance given.  Annual physical exam Annual physical as above, patient had blood work drawn today.  Acute bilateral low back pain Benign exam, no red flags. No x-rays needed, patient may use Tylenol, avoiding NSAIDs due to breast-feeding for now. Rehab exercises given, do this for 6 weeks before considering imaging.  ___________________________________________ Ihor Austin. Benjamin Stain, M.D., ABFM., CAQSM. Primary Care and Sports Medicine [place] [facility]  Adjunct Instructor of Family Medicine  [employer]

## 2017-12-31 NOTE — Assessment & Plan Note (Signed)
Annual physical as above, patient had blood work drawn today.

## 2018-01-01 ENCOUNTER — Other Ambulatory Visit: Payer: Self-pay | Admitting: Sports Medicine

## 2018-01-01 LAB — COMPREHENSIVE METABOLIC PANEL
AG Ratio: 1.6 (calc) (ref 1.0–2.5)
AST: 21 U/L (ref 10–30)
Albumin: 4.5 g/dL (ref 3.6–5.1)
Alkaline phosphatase (APISO): 63 U/L (ref 33–115)
CO2: 28 mmol/L (ref 20–32)
Calcium: 10 mg/dL (ref 8.6–10.2)
Chloride: 99 mmol/L (ref 98–110)
Globulin: 2.9 g/dL (calc) (ref 1.9–3.7)
Glucose, Bld: 103 mg/dL — ABNORMAL HIGH (ref 65–99)
Potassium: 4.5 mmol/L (ref 3.5–5.3)
Sodium: 137 mmol/L (ref 135–146)

## 2018-01-01 LAB — HEMOGLOBIN A1C
Hgb A1c MFr Bld: 5.7 %{Hb} — ABNORMAL HIGH (ref <5.7)
Mean Plasma Glucose: 117 (calc)
eAG (mmol/L): 6.5 (calc)

## 2018-01-01 LAB — CBC
HCT: 39.9 % (ref 35.0–45.0)
Hemoglobin: 13.4 g/dL (ref 11.7–15.5)
MCH: 28.3 pg (ref 27.0–33.0)
MCHC: 33.6 g/dL (ref 32.0–36.0)
MCV: 84.2 fL (ref 80.0–100.0)
MPV: 9.8 fL (ref 7.5–12.5)
Platelets: 357 Thousand/uL (ref 140–400)
RBC: 4.74 10*6/uL (ref 3.80–5.10)
RDW: 12.4 % (ref 11.0–15.0)
WBC: 8.9 Thousand/uL (ref 3.8–10.8)

## 2018-01-01 LAB — TSH: TSH: 3.23 mIU/L

## 2018-01-01 LAB — LIPID PANEL W/REFLEX DIRECT LDL
Cholesterol: 181 mg/dL (ref <200)
HDL: 57 mg/dL (ref >50)
LDL Cholesterol (Calc): 99 mg/dL
Non-HDL Cholesterol (Calc): 124 mg/dL (calc) (ref <130)
Total CHOL/HDL Ratio: 3.2 (calc) (ref <5.0)
Triglycerides: 151 mg/dL — ABNORMAL HIGH (ref <150)

## 2018-01-01 LAB — COMPREHENSIVE METABOLIC PANEL WITH GFR
ALT: 35 U/L — ABNORMAL HIGH (ref 6–29)
BUN: 7 mg/dL (ref 7–25)
Creat: 0.68 mg/dL (ref 0.50–1.10)
Total Bilirubin: 0.5 mg/dL (ref 0.2–1.2)
Total Protein: 7.4 g/dL (ref 6.1–8.1)

## 2018-01-01 LAB — VITAMIN D 25 HYDROXY (VIT D DEFICIENCY, FRACTURES): Vit D, 25-Hydroxy: 27 ng/mL — ABNORMAL LOW (ref 30–100)

## 2018-01-01 MED ORDER — VITAMIN D (ERGOCALCIFEROL) 1.25 MG (50000 UNIT) PO CAPS: 50000.0000 [IU] | ORAL_CAPSULE | ORAL | 0 refills | Status: DC

## 2018-02-11 ENCOUNTER — Ambulatory Visit: Payer: 59 | Admitting: Sports Medicine

## 2018-04-22 DIAGNOSIS — H16142 Punctate keratitis, left eye: Secondary | ICD-10-CM | POA: Diagnosis not present

## 2018-04-29 DIAGNOSIS — E781 Pure hyperglyceridemia: Secondary | ICD-10-CM

## 2018-04-29 DIAGNOSIS — D649 Anemia, unspecified: Secondary | ICD-10-CM

## 2018-04-29 DIAGNOSIS — E559 Vitamin D deficiency, unspecified: Secondary | ICD-10-CM

## 2018-04-29 DIAGNOSIS — R7309 Other abnormal glucose: Secondary | ICD-10-CM

## 2018-04-29 HISTORY — DX: Pure hyperglyceridemia: E78.1

## 2018-04-29 HISTORY — DX: Other abnormal glucose: R73.09

## 2018-04-29 HISTORY — DX: Anemia, unspecified: D64.9

## 2018-04-29 HISTORY — DX: Vitamin D deficiency, unspecified: E55.9

## 2018-05-28 DIAGNOSIS — R040 Epistaxis: Secondary | ICD-10-CM | POA: Diagnosis not present

## 2019-11-23 ENCOUNTER — Encounter: Payer: Self-pay | Admitting: Sports Medicine

## 2019-11-23 ENCOUNTER — Ambulatory Visit (INDEPENDENT_AMBULATORY_CARE_PROVIDER_SITE_OTHER): Payer: 59 | Admitting: Sports Medicine

## 2019-11-23 ENCOUNTER — Other Ambulatory Visit: Payer: Self-pay

## 2019-11-23 DIAGNOSIS — M222X1 Patellofemoral disorders, right knee: Secondary | ICD-10-CM | POA: Diagnosis not present

## 2019-11-23 NOTE — Progress Notes (Signed)
Subjective:    CC: Right knee pain  HPI: For the past few weeks this 33 year old female has had increasing pain in the anterior right knee, or squatting, going up and down stairs, moderate, persistent, local is without radiation, no trauma.  She is also due for her physical and some labs.  I reviewed the past medical history, family history, social history, surgical history, and allergies today and no changes were needed.  Please see the problem list section below in epic for further details.  Past Medical History: Past Medical History:  Diagnosis Date  . Anemia   . Diabetes mellitus without complication (HCC)   . Elevated hemoglobin A1c 04/29/2018  . Gestational diabetes   . High triglycerides 04/29/2018  . Irregular periods/menstrual cycles   . Low hemoglobin 04/29/2018  . PCOS (polycystic ovarian syndrome)   . Runner's knee   . Vitamin D deficiency 04/29/2018   Past Surgical History: Past Surgical History:  Procedure Laterality Date  . CESAREAN SECTION N/A 09/07/2017   Procedure: CESAREAN SECTION;  Surgeon: Shea Evans, MD;  Location: [facility] BIRTHING SUITES;  Service: Obstetrics;  Laterality: N/A;   Social History: Social History   Socioeconomic History  . Marital status: Married    Spouse name: Not on file  . Number of children: Not on file  . Years of education: Not on file  . Highest education level: Not on file  Occupational History  . Not on file  Tobacco Use  . Smoking status: Never Smoker  . Smokeless tobacco: Never Used  Substance and Sexual Activity  . Alcohol use: No  . Drug use: No  . Sexual activity: Yes  Other Topics Concern  . Not on file  Social History Narrative  . Not on file   Social Determinants of Health   Financial Resource Strain:   . Difficulty of Paying Living Expenses: Not on file  Food Insecurity:   . Worried About Programme researcher, broadcasting/film/video in the Last Year: Not on file  . Ran Out of Food in the Last Year: Not on file  Transportation  Needs:   . Lack of Transportation (Medical): Not on file  . Lack of Transportation (Non-Medical): Not on file  Physical Activity:   . Days of Exercise per Week: Not on file  . Minutes of Exercise per Session: Not on file  Stress:   . Feeling of Stress : Not on file  Social Connections:   . Frequency of Communication with Friends and Family: Not on file  . Frequency of Social Gatherings with Friends and Family: Not on file  . Attends Religious Services: Not on file  . Active Member of Clubs or Organizations: Not on file  . Attends Banker Meetings: Not on file  . Marital Status: Not on file   Family History: Family History  Problem Relation Age of Onset  . Heart attack Father   . Diabetes Father   . Cancer Mother 5       breast  . Diabetes Paternal Grandfather    Allergies: No Known Allergies Medications: See med rec.  Review of Systems: No fevers, chills, night sweats, weight loss, chest pain, or shortness of breath.   Objective:    General: Well Developed, well nourished, and in no acute distress.  Neuro: Alert and oriented x3, extra-ocular muscles intact, sensation grossly intact.  HEENT: Normocephalic, atraumatic, pupils equal round reactive to light, neck supple, no masses, no lymphadenopathy, thyroid nonpalpable.  Skin: Warm and dry, no rashes.  Cardiac: Regular rate and rhythm, no murmurs rubs or gallops, no lower extremity edema.  Respiratory: Clear to auscultation bilaterally. Not using accessory muscles, speaking in full sentences. Right knee: Normal to inspection with no erythema or effusion or obvious bony abnormalities. Pain at the patellar facets with crepitus ROM normal in flexion and extension and lower leg rotation. Ligaments with solid consistent endpoints including ACL, PCL, LCL, MCL. Negative Mcmurray's and provocative meniscal tests. Non painful patellar compression. Patellar and quadriceps tendons unremarkable. Hamstring and quadriceps  strength is normal.  Impression and Recommendations:    Patellofemoral syndrome, right Bilateral patellofemoral symptoms, right worse than left now, she has a 64-1/2-year-old, and has been spending a lot more time on her knees. Declines oral NSAIDs, adding formal physical therapy, she will avoid provocative positions, ice 20 minutes 3-4 times a day return to see me in 6 weeks.   ___________________________________________ Gwen Her. Dianah Field, M.D., ABFM., CAQSM. Primary Care and Sports Medicine  [facility]  Adjunct Professor of [place] [place] [employer]

## 2019-11-23 NOTE — Assessment & Plan Note (Signed)
Bilateral patellofemoral symptoms, right worse than left now, she has a 62-1/33-year-old, and has been spending a lot more time on her knees. Declines oral NSAIDs, adding formal physical therapy, she will avoid provocative positions, ice 20 minutes 3-4 times a day return to see me in 6 weeks.

## 2019-12-07 ENCOUNTER — Other Ambulatory Visit: Payer: Self-pay

## 2019-12-07 ENCOUNTER — Encounter: Payer: Self-pay | Admitting: Physical Therapy

## 2019-12-07 ENCOUNTER — Ambulatory Visit (INDEPENDENT_AMBULATORY_CARE_PROVIDER_SITE_OTHER): Payer: 59 | Admitting: Physical Therapy

## 2019-12-07 DIAGNOSIS — M5416 Radiculopathy, lumbar region: Secondary | ICD-10-CM | POA: Diagnosis not present

## 2019-12-07 DIAGNOSIS — M25561 Pain in right knee: Secondary | ICD-10-CM | POA: Diagnosis not present

## 2019-12-07 NOTE — Patient Instructions (Signed)
Access Code: [email or web address]  URL: [email or web address]  Date: 12/07/2019  Prepared by: Madelyn Flavors   Exercises Seated Long Arc Quad - 10 reps - 3 sets - 5 sec hold - 1x daily - 7x weekly Supine Hamstring Stretch with Strap - 3 reps - 1 sets - 60 sec hold - 2x daily - 7x weekly

## 2019-12-07 NOTE — Therapy (Signed)
[place], [place], [address] Phone: [phone]   Fax:  [phone]  Physical Therapy Evaluation  Patient Details  Name: Antoninette Lerner MRN: [id number] Date of Birth: [birth year] Referring Provider (PT): Thekkekandam   Encounter Date: 12/07/2019  PT End of Session - 12/07/19 3154    Visit Number  1    Number of Visits  12    Date for PT Re-Evaluation  01/18/20    PT Start Time  1446    PT Stop Time  1537    PT Time Calculation (min)  51 min    Activity Tolerance  Patient tolerated treatment well    Behavior During Therapy  [facility] for tasks assessed/performed       Past Medical History:  Diagnosis Date  . Anemia   . Diabetes mellitus without complication ([place])   . Elevated hemoglobin A1c 04/29/2018  . Gestational diabetes   . High triglycerides 04/29/2018  . Irregular periods/menstrual cycles   . Low hemoglobin 04/29/2018  . PCOS (polycystic ovarian syndrome)   . Runner's knee   . Vitamin D deficiency 04/29/2018    Past Surgical History:  Procedure Laterality Date  . CESAREAN SECTION N/A 09/07/2017   Procedure: CESAREAN SECTION;  Surgeon: Azucena Fallen, MD;  Location: [facility];  Service: Obstetrics;  Laterality: N/A;    There were no vitals filed for this visit.   Subjective Assessment - 12/07/19 1453    Subjective  Patient began experiencing Rt knee pain after moving into a new house in October which had stairs. She now has pain with stair climbing and standing over one hour, kneeling and squatting. Walking is not a probem.    Patient is accompained by:  Family member    Pertinent History  csection, Vit D deficiency    Limitations  Other (comment)    How long can you stand comfortably?  10 min    Patient Stated Goals  to get rid of pain    Currently in Pain?  Yes    Pain Score  10-Worst pain ever    Pain Location  Knee    Pain Orientation  Right    Pain Type  Acute  pain    Pain Onset  More than a month ago    Pain Frequency  Intermittent    Aggravating Factors   kneeling bending, standing, stairs    Pain Relieving Factors  rest         [facility] PT Assessment - 12/07/19 0001      Assessment   Medical Diagnosis  Right PFPS    Referring Provider (PT)  Thekkekandam    Onset Date/Surgical Date  09/10/19    Next MD Visit  6 weeks      Precautions   Precautions  None      Restrictions   Weight Bearing Restrictions  No      Balance Screen   Has the patient fallen in the past 6 months  No    Has the patient had a decrease in activity level because of a fear of falling?   No    Is the patient reluctant to leave their home because of a fear of falling?   No      Home Environment   Living Environment  Private residence    Living Arrangements  Spouse/significant other;Children    Type of Home  House    Additional Comments  2 story home  Prior Function   Level of Independence  Independent    Vocation  Full time employment      Posture/Postural Control   Posture/Postural Control  No significant limitations      ROM / Strength   AROM / PROM / Strength  AROM;Strength      AROM   AROM Assessment Site  Knee    Right/Left Knee  Right    Right Knee Extension  0    Right Knee Flexion  145      Strength   Overall Strength Comments  right hip ext 4/5, ABD 5/5, flex 4+/5; left hip knee grossly 5/5    Strength Assessment Site  Knee    Right/Left Knee  Right    Right Knee Flexion  4+/5    Right Knee Extension  5/5      Flexibility   Soft Tissue Assessment /Muscle Length  yes    Hamstrings  Rt > Lt    Quadriceps  WNL    ITB  neg ober bil    Piriformis  mild tightness bil      Palpation   Patella mobility  WNL    Palpation comment  tender at apex of right patella; tender along lateral quads and distal ITB right; also around greater trochanter and right glut min/med      Special Tests    Special Tests  Knee Special Tests    Other  special tests  + SLR on RLE, neg on left      Patellofemoral Grind test (Clark's Sign)   Findings  Postive    Side   Right                Objective measurements completed on examination: See above findings.      [facility] Adult PT Treatment/Exercise - 12/07/19 0001      Exercises   Exercises  Knee/Hip      Knee/Hip Exercises: Stretches   Active Hamstring Stretch  Right;Left;1 rep;20 seconds    Active Hamstring Stretch Limitations  with strap    ITB Stretch  Right;1 rep    ITB Stretch Limitations  attempted but radicular pain to lateral lower leg    Piriformis Stretch  Right;Left    Piriformis Stretch Limitations  sitting pt does not feel figure 4; mild at iscial tuberosity with supine right.      Knee/Hip Exercises: Seated   Long Arc Quad  Right;2 sets;5 sets    [place] Limitations  straight and with ER 5 sec hold             PT Education - 12/07/19 1540    Education Details  HEP    Person(s) Educated  Patient    Methods  Explanation;Demonstration;Handout    Comprehension  Verbalized understanding;Returned demonstration          PT Long Term Goals - 12/07/19 1607      PT LONG TERM GOAL #1   Title  Ind with HEP for strength and flexibility    Time  6    Period  Weeks    Status  New    Target Date  01/18/20      PT LONG TERM GOAL #2   Title  Pt to report decreased pain with ADLS by 75% in the right knee.    Time  6    Period  Weeks    Status  New      PT LONG TERM GOAL #3   Title  Patient able to stand to perform ADLS without pain in her right knee.    Time  6    Period  Weeks    Status  New      PT LONG TERM GOAL #4   Title  Patient able to climb stairs with 2/10 pain or less.    Time  6    Period  Weeks    Status  New      PT LONG TERM GOAL #5   Title  Improved FOTO to <= 26% limitation    Time  6    Period  Weeks    Status  New             Plan - 12/07/19 1540    Clinical Impression Statement  Patient presents with  c/o of right knee pain beginning after she moved into her new home with stairs. She also has a toddler and has pain kneeling and squatting. She has pain with stairs and prolonged standing as well. Patient also demonstrates some signs and sx consistent with lumbar radicular pain on the right to her lateral lower leg. Her back was not fully assessed today. Overall she has normal knee ROM and strength. She has some right hip weakness and core weakness with stabilization. She has crepitus in bil knees with extension and has flexibilty deficits in her RLE. She will benefit from PT to address her deficits and decrease pain with ADLS.    Personal Factors and Comorbidities  Comorbidity 2    Comorbidities  csection, vit D deficiency    Examination-Activity Limitations  Squat;Stairs;Stand    Clinical Decision Making  Low    Rehab Potential  Excellent    PT Frequency  2x / week    PT Duration  6 weeks    PT Treatment/Interventions  ADLs/Self Care Home Management;Cryotherapy;Electrical Stimulation;Iontophoresis 4mg /ml Dexamethasone;Moist Heat;Ultrasound;Therapeutic exercise;Neuromuscular re-education;Patient/family education;Dry needling;Manual techniques;Joint Manipulations;Spinal Manipulations    PT Next Visit Plan  assess low back; RLE strengthening and flexibility    PT Home Exercise Plan  [email or web address]    Consulted and Agree with Plan of Care  Patient       Patient will benefit from skilled therapeutic intervention in order to improve the following deficits and impairments:  Pain, Decreased activity tolerance, Impaired flexibility, Decreased strength  Visit Diagnosis: Acute pain of right knee - Plan: PT plan of care cert/re-cert  Radiculopathy, lumbar region - Plan: PT plan of care cert/re-cert     Problem List Patient Active Problem List   Diagnosis Date Noted  . Vitamin D deficiency 04/29/2018  . Low hemoglobin 04/29/2018  . Elevated hemoglobin A1c 04/29/2018  . High triglycerides 04/29/2018  .  Acute bilateral low back pain 12/31/2017  . Grieving 11/07/2017  . Tension headache 11/07/2017  . GDM, class A1 09/09/2017  . Acute blood loss anemia 09/09/2017  . Cesarean delivery delivered / Indication: failure to descend 09/08/2017  . Postpartum care following cesarean delivery 9/29 09/08/2017  . Annual physical exam 11/30/2015  . 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 Family planning 10/25/2014  . PCOS (polycystic ovarian syndrome) 10/25/2014   Solon PalmJulie Kevin Mario PT 12/07/2019, 4:16 PM  [facility] [address] [place], [place], [address] Phone: [phone]   Fax:  [phone]  Name: Glenis SmokerSaraswathi Bar MRN: [id number] Date of Birth: [birth year]

## 2019-12-10 ENCOUNTER — Encounter: Payer: Self-pay | Admitting: Physical Therapy

## 2019-12-10 ENCOUNTER — Ambulatory Visit (INDEPENDENT_AMBULATORY_CARE_PROVIDER_SITE_OTHER): Payer: 59 | Admitting: Physical Therapy

## 2019-12-10 ENCOUNTER — Other Ambulatory Visit: Payer: Self-pay

## 2019-12-10 DIAGNOSIS — M5416 Radiculopathy, lumbar region: Secondary | ICD-10-CM

## 2019-12-10 DIAGNOSIS — M25561 Pain in right knee: Secondary | ICD-10-CM | POA: Diagnosis not present

## 2019-12-10 NOTE — Therapy (Addendum)
[place], [place], [address] Phone: [phone]   Fax:  [phone]  Physical Therapy Treatment  Patient Details  Name: Manroop Jakubowicz MRN: [id number] Date of Birth: [birth year] Referring Provider (PT): Dianah Field   Encounter Date: 12/10/2019  PT End of Session - 12/10/19 1254    Visit Number  2    Number of Visits  12    Date for PT Re-Evaluation  01/18/20    PT Start Time  1020    PT Stop Time  1102    PT Time Calculation (min)  42 min    Activity Tolerance  Patient tolerated treatment well    Behavior During Therapy  [facility] for tasks assessed/performed       Past Medical History:  Diagnosis Date  . Anemia   . Diabetes mellitus without complication ([place])   . Elevated hemoglobin A1c 04/29/2018  . Gestational diabetes   . High triglycerides 04/29/2018  . Irregular periods/menstrual cycles   . Low hemoglobin 04/29/2018  . PCOS (polycystic ovarian syndrome)   . Runner's knee   . Vitamin D deficiency 04/29/2018    Past Surgical History:  Procedure Laterality Date  . CESAREAN SECTION N/A 09/07/2017   Procedure: CESAREAN SECTION;  Surgeon: Azucena Fallen, MD;  Location: [facility];  Service: Obstetrics;  Laterality: N/A;    There were no vitals filed for this visit.  Subjective Assessment - 12/10/19 1251    Subjective  Pt arriving to therapy reporting pain in R knee of 5/10.    Patient is accompained by:  Family member    Pertinent History  csection, Vit D deficiency    Limitations  Other (comment)    How long can you stand comfortably?  10 min    Patient Stated Goals  to get rid of pain    Currently in Pain?  Yes    Pain Score  5     Pain Orientation  Right    Pain Descriptors / Indicators  Aching    Pain Type  Acute pain    Pain Onset  More than a month ago    Pain Frequency  Intermittent                       [facility] Adult PT Treatment/Exercise -  12/10/19 0001      Exercises   Exercises  Knee/Hip      Knee/Hip Exercises: Stretches   Active Hamstring Stretch  Right;Left;3 reps;30 seconds    Active Hamstring Stretch Limitations  with strap    Piriformis Stretch  Both;3 reps;30 seconds    Piriformis Stretch Limitations  tolerated well today      Knee/Hip Exercises: Seated   Long Arc Quad  Right;2 sets;10 reps    Long Arc Quad Limitations  edu to slow movements to allow for eccentric control      Knee/Hip Exercises: Supine   Straight Leg Raises  Strengthening;Both;10 reps;Limitations    Straight Leg Raises Limitations  instructions to keep knee locked in full extension during lifting    Patellar Mobs  R knee      Knee/Hip Exercises: Sidelying   Hip ABduction  Strengthening;15 reps    Hip ADduction  Strengthening;Both;10 reps;Limitations    Hip ADduction Limitations  instructions for positioning      Manual Therapy   Manual Therapy  Soft tissue mobilization    Manual therapy comments  10 minutes    Soft tissue mobilization  to SI joint on R side, lumbar paraspinals                  PT Long Term Goals - 12/10/19 1300      PT LONG TERM GOAL #1   Title  Ind with HEP for strength and flexibility    Time  6    Period  Weeks    Status  On-going      PT LONG TERM GOAL #2   Title  Pt to report decreased pain with ADLS by 38% in the right knee.    Time  6    Period  Weeks    Status  New      PT LONG TERM GOAL #3   Title  Patient able to stand to perform ADLS without pain in her right knee.    Time  6    Period  Weeks    Status  New      PT LONG TERM GOAL #4   Title  Patient able to climb stairs with 2/10 pain or less.    Time  6    Period  Weeks      PT LONG TERM GOAL #5   Title  Improved FOTO to <= 26% limitation    Time  6    Period  Weeks    Status  New            Plan - 12/10/19 1254    Clinical Impression Statement  Pt arrving to therapy reporting 5/10 pain in her R knee and pain with  activity in her SI joint more on the R side. Pt reporting doing her HEP, but reporting soreness. Pt's HEP was reviewed and execises added. Pt's husband was instructed in how to perform gentle STM to lumbar paraspinals. Continue skilled PT to progress toward pt's PLOF.    Personal Factors and Comorbidities  Comorbidity 2    Comorbidities  csection, vit D deficiency    Examination-Activity Limitations  Squat;Stairs;Stand    Rehab Potential  Excellent    PT Frequency  2x / week    PT Duration  6 weeks    PT Treatment/Interventions  ADLs/Self Care Home Management;Cryotherapy;Electrical Stimulation;Iontophoresis 34m/ml Dexamethasone;Moist Heat;Ultrasound;Therapeutic exercise;Neuromuscular re-education;Patient/family education;Dry needling;Manual techniques;Joint Manipulations;Spinal Manipulations    PT Next Visit Plan  assess low back; RLE strengthening and flexibility    PT Home Exercise Plan  [email or web address]    Consulted and Agree with Plan of Care  Patient       Patient will benefit from skilled therapeutic intervention in order to improve the following deficits and impairments:  Pain, Decreased activity tolerance, Impaired flexibility, Decreased strength  Visit Diagnosis: Acute pain of right knee  Radiculopathy, lumbar region     Problem List Patient Active Problem List   Diagnosis Date Noted  . Vitamin D deficiency 04/29/2018  . Low hemoglobin 04/29/2018  . Elevated hemoglobin A1c 04/29/2018  . High triglycerides 04/29/2018  . Acute bilateral low back pain 12/31/2017  . Grieving 11/07/2017  . Tension headache 11/07/2017  . GDM, class A1 09/09/2017  . Acute blood loss anemia 09/09/2017  . Cesarean delivery delivered / Indication: failure to descend 09/08/2017  . Postpartum care following cesarean delivery 9/29 09/08/2017  . Annual physical exam 11/30/2015  . 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 Family planning 10/25/2014  . PCOS (polycystic ovarian syndrome) 10/25/2014    PHYSICAL THERAPY DISCHARGE SUMMARY  Visits from Start of Care: 2  Current functional level related to goals /  functional outcomes: See goals   Remaining deficits: See goals   Education / Equipment: HEP Plan: Patient agrees to discharge.  Patient goals were not met. Patient is being discharged due to not returning since the last visit.  ?????      Oretha Caprice, PT 12/10/2019, 1:01 PM  [facility] [place], [place], [address] Phone: [phone]   Fax:  [phone]  Name: Analynn Daum MRN: [id number] Date of Birth: 1986/08/07

## 2019-12-15 ENCOUNTER — Encounter: Payer: Self-pay | Admitting: Physical Therapy

## 2019-12-16 ENCOUNTER — Encounter: Payer: 59 | Admitting: Physical Therapy

## 2019-12-18 ENCOUNTER — Encounter: Payer: 59 | Admitting: Physical Therapy

## 2019-12-22 ENCOUNTER — Encounter: Payer: 59 | Admitting: Physical Therapy

## 2019-12-24 ENCOUNTER — Encounter: Payer: 59 | Admitting: Physical Therapy

## 2020-01-01 ENCOUNTER — Ambulatory Visit (INDEPENDENT_AMBULATORY_CARE_PROVIDER_SITE_OTHER): Payer: 59

## 2020-01-01 ENCOUNTER — Telehealth: Payer: Self-pay

## 2020-01-01 ENCOUNTER — Other Ambulatory Visit: Payer: Self-pay

## 2020-01-01 ENCOUNTER — Ambulatory Visit (INDEPENDENT_AMBULATORY_CARE_PROVIDER_SITE_OTHER): Payer: 59 | Admitting: Sports Medicine

## 2020-01-01 DIAGNOSIS — M25561 Pain in right knee: Secondary | ICD-10-CM

## 2020-01-01 DIAGNOSIS — M222X1 Patellofemoral disorders, right knee: Secondary | ICD-10-CM

## 2020-01-01 DIAGNOSIS — M25562 Pain in left knee: Secondary | ICD-10-CM

## 2020-01-01 DIAGNOSIS — E559 Vitamin D deficiency, unspecified: Secondary | ICD-10-CM

## 2020-01-01 DIAGNOSIS — R7309 Other abnormal glucose: Secondary | ICD-10-CM

## 2020-01-01 LAB — VITAMIN D 25 HYDROXY (VIT D DEFICIENCY, FRACTURES): Vit D, 25-Hydroxy: 15 ng/mL — ABNORMAL LOW (ref 30–100)

## 2020-01-01 NOTE — Assessment & Plan Note (Signed)
This pleasant 34 year old female continues to have right knee pain, mostly patellofemoral in nature. She has done a couple sessions of physical therapy, she continues to decline pharmacologic treatment. I like her to continue her rehabilitation at home, I am also going to add some updated knee x-rays, in the past they have showed medial compartment and patellofemoral osteoarthritis. Return to see me in 6 weeks.

## 2020-01-01 NOTE — Telephone Encounter (Signed)
Ordered all labs.

## 2020-01-01 NOTE — Telephone Encounter (Signed)
Patient has been notified, stated they went ahead and did the Vitamin D labs without the orders in even though we informed them that T would look at ordering labs.

## 2020-01-01 NOTE — Progress Notes (Signed)
    Procedures performed today:    None.  Independent interpretation of tests performed by another provider:   Personal review of previous x-rays showed mild medial compartment and patellofemoral osteoarthritis  Impression and Recommendations:    Patellofemoral syndrome, right This pleasant 34 year old female continues to have right knee pain, mostly patellofemoral in nature. She has done a couple sessions of physical therapy, she continues to decline pharmacologic treatment. I like her to continue her rehabilitation at home, I am also going to add some updated knee x-rays, in the past they have showed medial compartment and patellofemoral osteoarthritis. Return to see me in 6 weeks.    ___________________________________________ Ihor Austin. Benjamin Stain, M.D., ABFM., CAQSM. Primary Care and Sports Medicine [facility]  Adjunct Instructor of Family Medicine  [employer]

## 2020-01-01 NOTE — Telephone Encounter (Signed)
Her husband is requesting a lab order for vitamin D. Please advise.

## 2020-01-01 NOTE — Telephone Encounter (Signed)
Can you reach out to patient's husband and let him know Dr T has ordered labs. One of the labs has to be done with an 8 hour fasting. Nothing to eat or drink 8 hours prior to blood draw. Ok to take medications and drink water.

## 2020-01-03 ENCOUNTER — Other Ambulatory Visit: Payer: Self-pay | Admitting: Sports Medicine

## 2020-01-03 MED ORDER — VITAMIN D (ERGOCALCIFEROL) 1.25 MG (50000 UNIT) PO CAPS: 50000.0000 [IU] | ORAL_CAPSULE | ORAL | 0 refills | Status: DC

## 2020-01-05 NOTE — Telephone Encounter (Signed)
Patient only did the vitamin D lab work.

## 2020-05-15 IMAGING — DX DG KNEE COMPLETE 4+V*L*
6 series · 6 of 6 positions shown · non-contrast
Comparison: None.

CLINICAL DATA: Bilateral knee pain right greater than left, no
known injury, initial encounter

EXAM:
LEFT KNEE - COMPLETE 4+ VIEW

[tunnel]
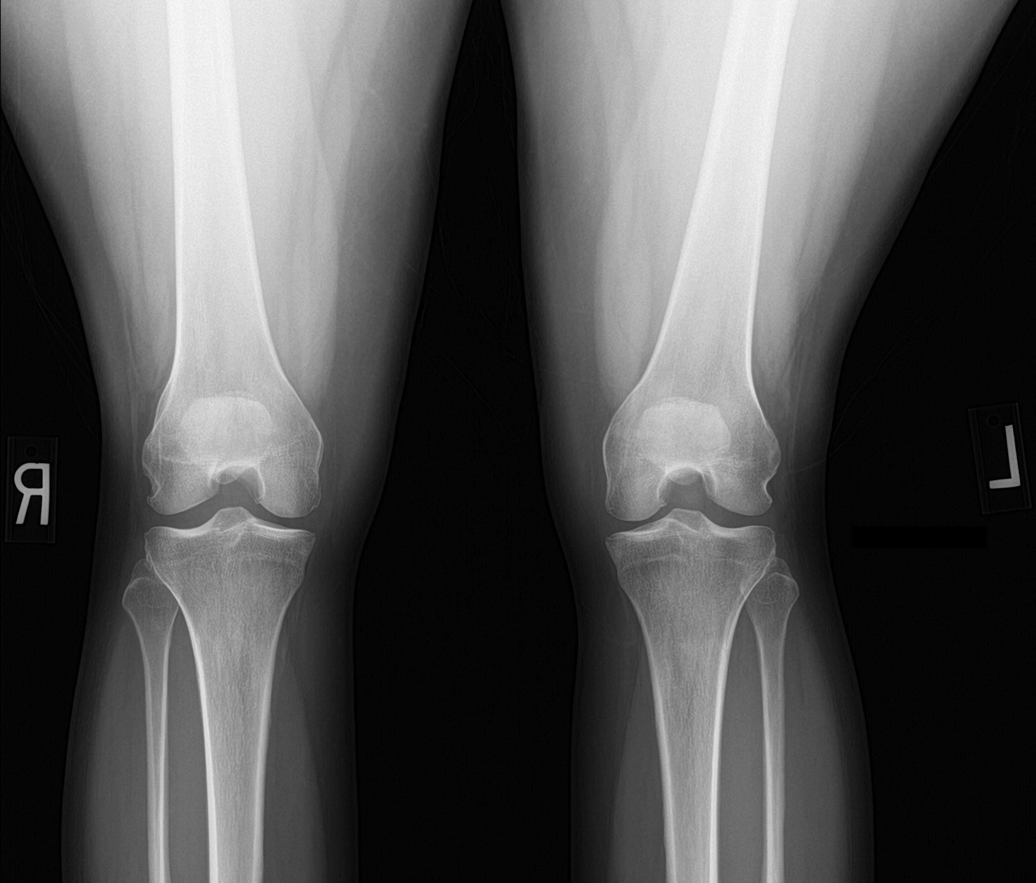

[knee lat (1 of 2)]
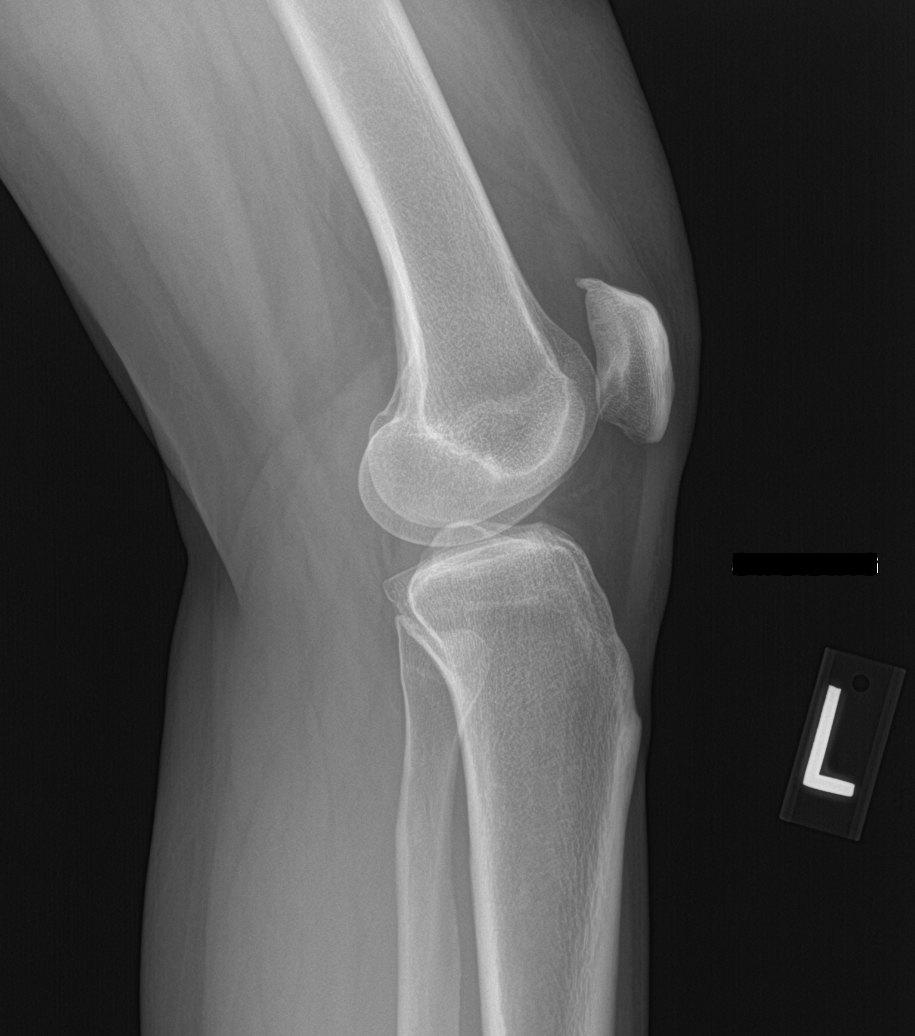

[knee sunrise (1 of 2)]
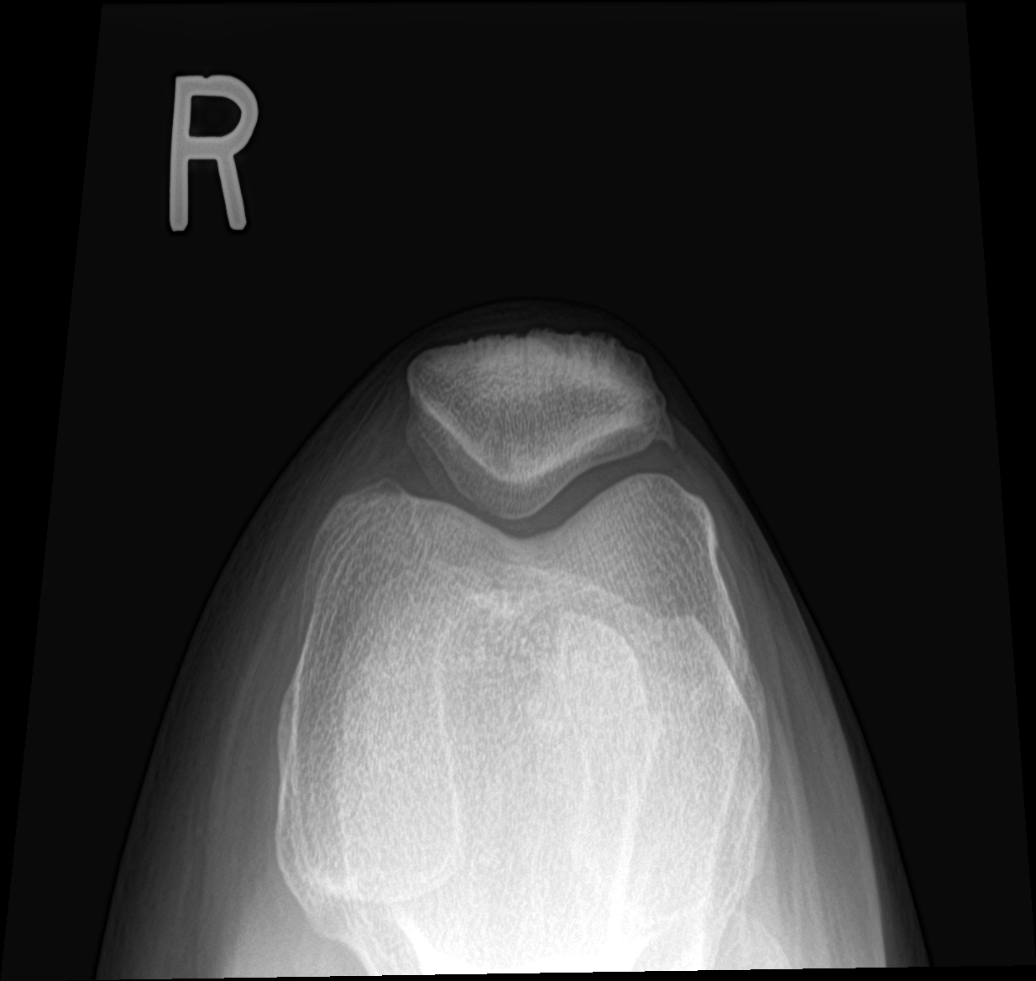

[knee ap bilat standing]
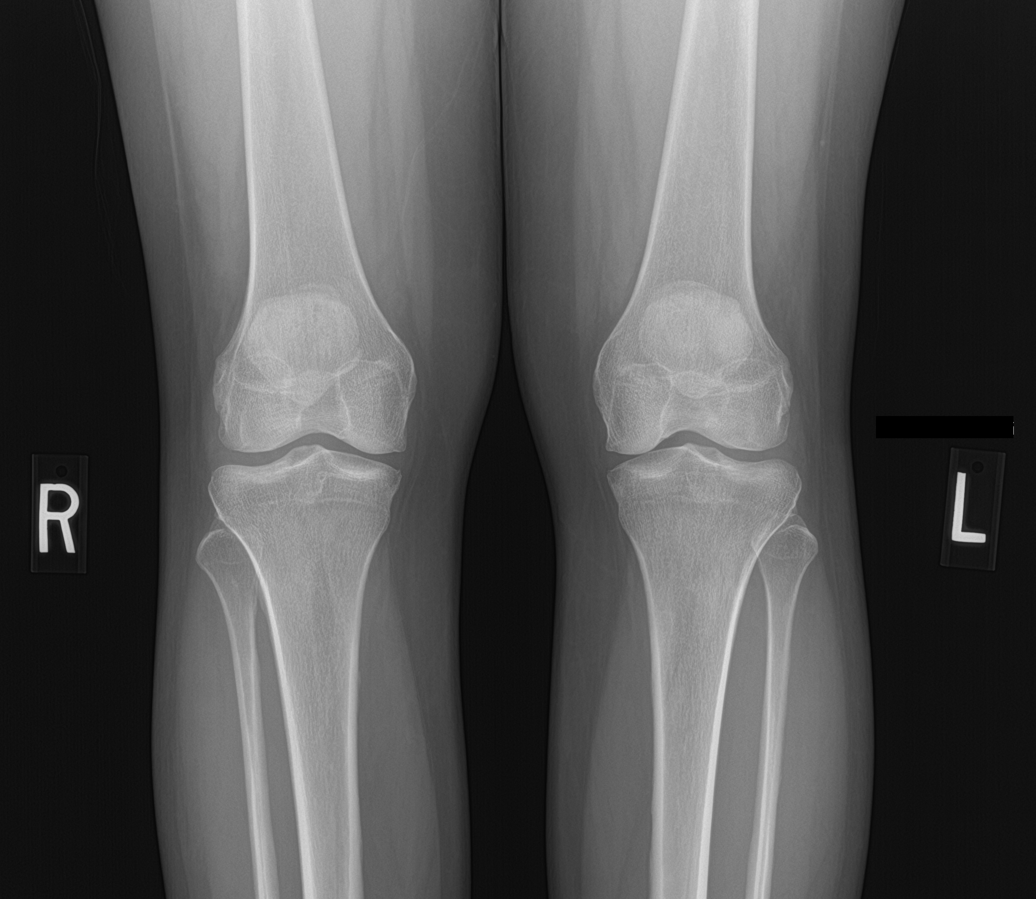

[knee lat (2 of 2)]
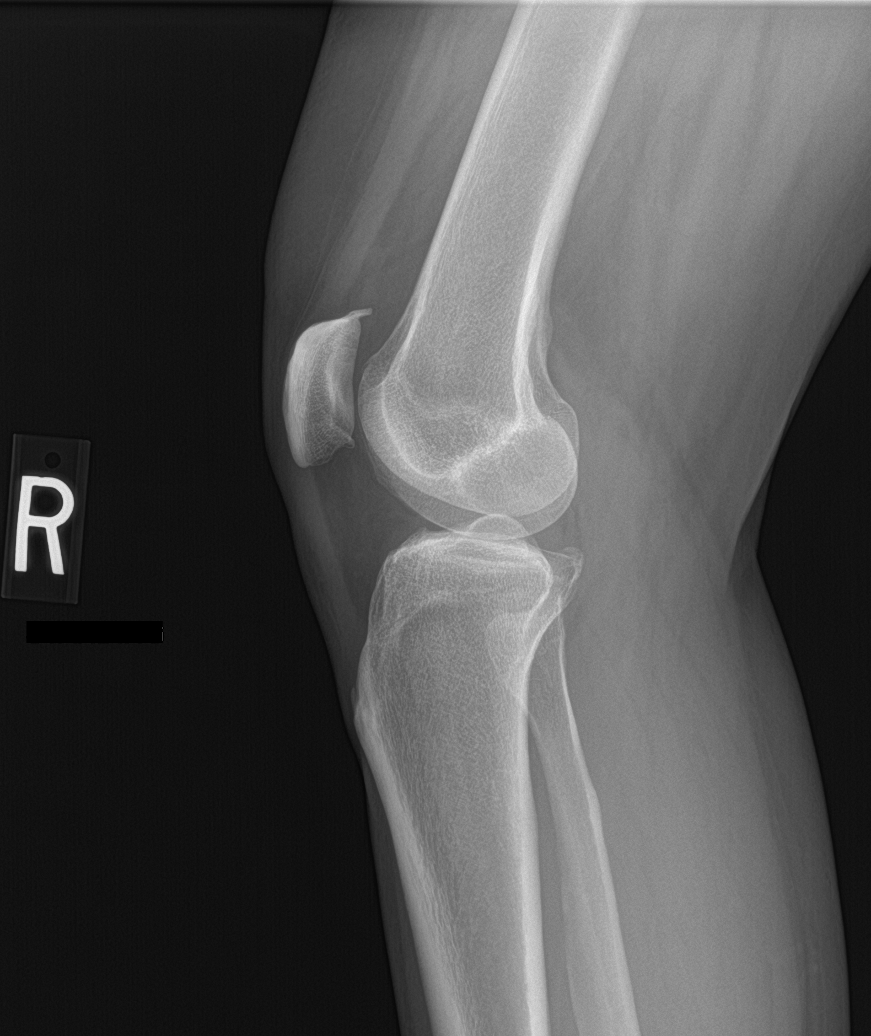

[knee sunrise (2 of 2)]
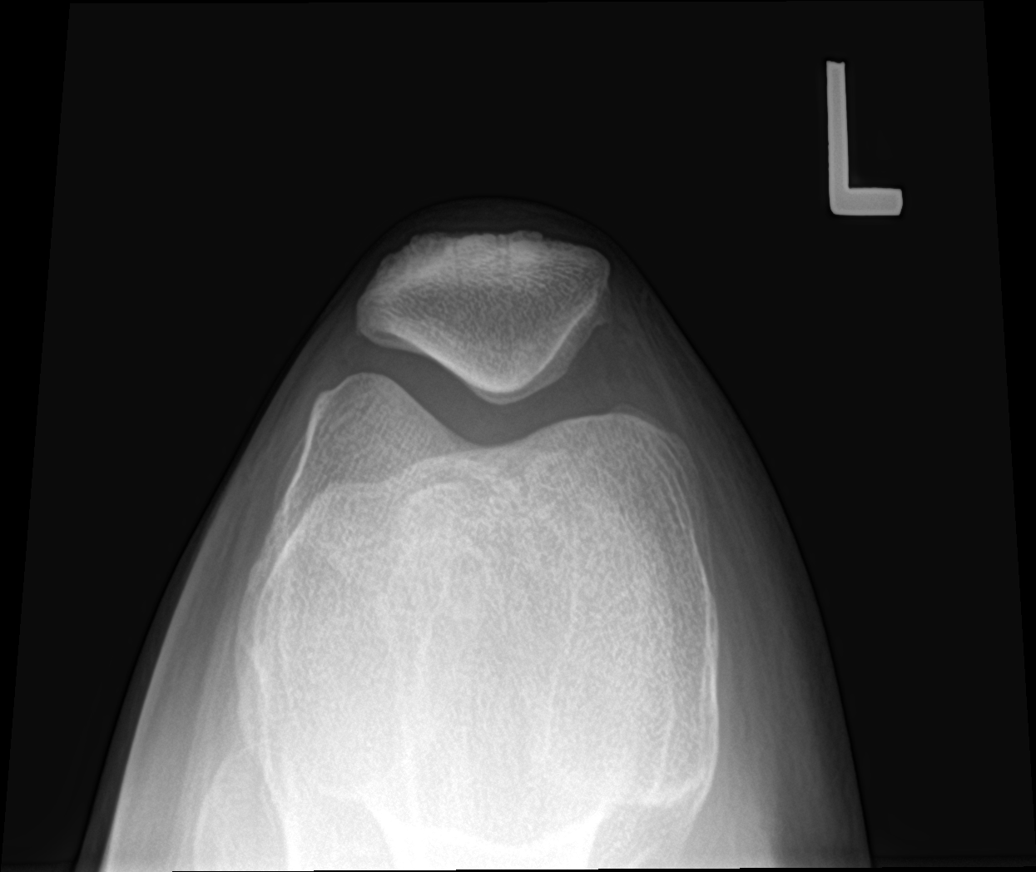

[6 of 6 positions shown; findings below may reference images not displayed]

FINDINGS: Mild patellofemoral degenerative changes are seen. No acute fracture
or dislocation is noted. No joint effusion is seen.
IMPRESSION: Mild degenerative change without acute abnormality.

## 2020-11-21 ENCOUNTER — Other Ambulatory Visit: Payer: Self-pay

## 2020-11-21 ENCOUNTER — Emergency Department (INDEPENDENT_AMBULATORY_CARE_PROVIDER_SITE_OTHER)
Admission: EM | Admit: 2020-11-21 | Discharge: 2020-11-21 | Disposition: A | Discharge status: Home / Self Care | Payer: 59 | Source: Home / Self Care

## 2020-11-21 DIAGNOSIS — K639 Disease of intestine, unspecified: Secondary | ICD-10-CM | POA: Diagnosis not present

## 2020-11-21 NOTE — Discharge Instructions (Addendum)
May try beano or digel for symptomatic relief

## 2020-11-21 NOTE — ED Triage Notes (Signed)
Pt c/o intermittent pain in her chest, mostly under LT breast area. Also some pain behind her LT shoulder. Pain 3/10  No hx of heart issues.

## 2020-11-21 NOTE — ED Provider Notes (Signed)
[name]    CSN: [id number] Arrival date & time: 11/21/20  1330      History   Chief Complaint No chief complaint on file.   HPI Michele Patterson is a 34 y.o. female.   Periodic left-sided chest pain.  No cough congestion fever.  Patient concerned about heart disease since her father had what she has no other risk factors.  Pain not related to exertion or any activity deep breathing etc. there are no cardiac risk factor and she still has menstrual periods which should confer some protection regarding heart problems  HPI  History reviewed. No pertinent past medical history.  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 No pertinent surgical history.  OB History   No obstetric history on file.      Home Medications    Prior to Admission medications   Not on File    Family History History reviewed. No pertinent family history.  Social History Social History   Tobacco Use   Smoking status: Never Smoker   Smokeless tobacco: Never Used  Building services engineer Use: Never used     Allergies   Patient has no known allergies.   Review of Systems Review of Systems  Respiratory: Negative for cough and shortness of breath.   Cardiovascular: Positive for chest pain.  All other systems reviewed and are negative.    Physical Exam Triage Vital Signs ED Triage Vitals  Enc Vitals Group     BP 11/21/20 1348 117/84     Pulse Rate 11/21/20 1348 92     Resp 11/21/20 1348 18     Temp 11/21/20 1348 99.5 F (37.5 C)     Temp Source 11/21/20 1348 Oral     SpO2 11/21/20 1348 100 %     Weight --      Height --      Head Circumference --      Peak Flow --      Pain Score 11/21/20 1349 3     Pain Loc --      Pain Edu? --      Excl. in GC? --    No data found.  Updated Vital Signs BP 117/84 (BP Location: Right Arm)    Pulse 92    Temp 99.5 F (37.5 C) (Oral)    Resp 18     LMP 08/27/2020 Comment: Irregular   SpO2 100%   Visual Acuity Right Eye Distance:   Left Eye Distance:   Bilateral Distance:    Right Eye Near:   Left Eye Near:    Bilateral Near:     Physical Exam Vitals and nursing note reviewed.  Constitutional:      Appearance: Normal appearance.  HENT:     Head: Normocephalic.  Cardiovascular:     Rate and Rhythm: Normal rate and regular rhythm.  Pulmonary:     Effort: Pulmonary effort is normal.     Breath sounds: Normal breath sounds.  Neurological:     General: No focal deficit present.     Mental Status: She is alert and oriented to person, place, and time.      UC Treatments / Results  Labs (all labs ordered are listed, but only abnormal results are displayed) Labs Reviewed - No data to display  EKG   Radiology No results found.  Procedures Procedures (including critical care time)  Medications Ordered in UC Medications -  No data to display  Initial Impression / Assessment and Plan / UC Course  I have reviewed the triage vital signs and the nursing notes.  Pertinent labs & imaging results that were available during my care of the patient were reviewed by me and considered in my medical decision making (see chart for details).    Left-sided chest pain, splenic flexure syndrome  Final Clinical Impressions(s) / UC Diagnoses   Final diagnoses:  Splenic flexure syndrome     Discharge Instructions     May try beano or digel for symptomatic relief   ED Prescriptions    None     PDMP not reviewed this encounter.   Frederica Kuster, MD 11/21/20 1420

## 2020-11-22 ENCOUNTER — Encounter: Payer: Self-pay | Admitting: Physical Therapy

## 2021-03-17 ENCOUNTER — Encounter: Payer: Self-pay | Admitting: Sports Medicine

## 2021-03-17 ENCOUNTER — Ambulatory Visit (INDEPENDENT_AMBULATORY_CARE_PROVIDER_SITE_OTHER): Payer: 59 | Admitting: Sports Medicine

## 2021-03-17 ENCOUNTER — Other Ambulatory Visit: Payer: Self-pay

## 2021-03-17 VITALS — BP 101/69 | HR 98 | Ht 62.0 in | Wt 131.0 lb

## 2021-03-17 DIAGNOSIS — R739 Hyperglycemia, unspecified: Secondary | ICD-10-CM

## 2021-03-17 DIAGNOSIS — R7309 Other abnormal glucose: Secondary | ICD-10-CM | POA: Diagnosis not present

## 2021-03-17 DIAGNOSIS — Z0000 Encounter for general adult medical examination without abnormal findings: Secondary | ICD-10-CM | POA: Diagnosis not present

## 2021-03-17 DIAGNOSIS — Z23 Encounter for immunization: Secondary | ICD-10-CM

## 2021-03-17 DIAGNOSIS — R7989 Other specified abnormal findings of blood chemistry: Secondary | ICD-10-CM

## 2021-03-17 LAB — POCT GLYCOSYLATED HEMOGLOBIN (HGB A1C): Hemoglobin A1C: 5.6 % (ref 4.0–5.6)

## 2021-03-17 NOTE — Patient Instructions (Signed)
Hemoglobin A1c is 5.6%, this is normal.

## 2021-03-17 NOTE — Assessment & Plan Note (Addendum)
Annual physical as above today. Tdap today. Up-to-date on other screening measures. She did get her routine labs with gynecology, they all looked good, her fasting glucose was a touch high.

## 2021-03-17 NOTE — Progress Notes (Signed)
Subjective:    CC: Annual Physical Exam  HPI:  This patient is here for their annual physical  I reviewed the past medical history, family history, social history, surgical history, and allergies today and no changes were needed.  Please see the problem list section below in epic for further details.  Past Medical History: Past Medical History:  Diagnosis Date  . Anemia   . Diabetes mellitus without complication (HCC)   . Elevated hemoglobin A1c 04/29/2018  . Gestational diabetes   . High triglycerides 04/29/2018  . Irregular periods/menstrual cycles   . Low hemoglobin 04/29/2018  . PCOS (polycystic ovarian syndrome)   . Runner's knee   . Vitamin D deficiency 04/29/2018   Past Surgical History: Past Surgical History:  Procedure Laterality Date  . CESAREAN SECTION N/A 09/07/2017   Procedure: CESAREAN SECTION;  Surgeon: Shea Evans, MD;  Location: [facility] BIRTHING SUITES;  Service: Obstetrics;  Laterality: N/A;   Social History: Social History   Socioeconomic History  . Marital status: Married    Spouse name: Not on file  . Number of children: Not on file  . Years of education: Not on file  . Highest education level: Not on file  Occupational History  . Not on file  Tobacco Use  . Smoking status: Never Smoker  . Smokeless tobacco: Never Used  Vaping Use  . Vaping Use: Never used  Substance and Sexual Activity  . Alcohol use: No  . Drug use: No  . Sexual activity: Yes  Other Topics Concern  . Not on file  Social History Narrative   ** Merged History Encounter **       Social Determinants of Health   Financial Resource Strain: Not on file  Food Insecurity: Not on file  Transportation Needs: Not on file  Physical Activity: Not on file  Stress: Not on file  Social Connections: Not on file   Family History: Family History  Problem Relation Age of Onset  . Heart attack Father   . Diabetes Father   . Cancer Mother 28       breast  . Diabetes Paternal  Grandfather    Allergies: No Known Allergies Medications: See med rec.  Review of Systems: No headache, visual changes, nausea, vomiting, diarrhea, constipation, dizziness, abdominal pain, skin rash, fevers, chills, night sweats, swollen lymph nodes, weight loss, chest pain, body aches, joint swelling, muscle aches, shortness of breath, mood changes, visual or auditory hallucinations.  Objective:    General: Well Developed, well nourished, and in no acute distress.  Neuro: Alert and oriented x3, extra-ocular muscles intact, sensation grossly intact. Cranial nerves II through XII are intact, motor, sensory, and coordinative functions are all intact. HEENT: Normocephalic, atraumatic, pupils equal round reactive to light, neck supple, no masses, no lymphadenopathy, thyroid nonpalpable. Oropharynx, nasopharynx, external ear canals are unremarkable. Skin: Warm and dry, no rashes noted.  Cardiac: Regular rate and rhythm, no murmurs rubs or gallops.  Respiratory: Clear to auscultation bilaterally. Not using accessory muscles, speaking in full sentences.  Abdominal: Soft, nontender, nondistended, positive bowel sounds, no masses, no organomegaly.  Musculoskeletal: Shoulder, elbow, wrist, hip, knee, ankle stable, and with full range of motion.  Impression and Recommendations:    The patient was counselled, risk factors were discussed, anticipatory guidance given.  Annual physical exam Annual physical as above today. Tdap today. Up-to-date on other screening measures. She did get her routine labs with gynecology, they all looked good, her fasting glucose was a touch high.  Elevated  hemoglobin A1c A1c was elevated in the past, she did have gestational diabetes. Hemoglobin A1c is 5.6% today, no further intervention needed.  Low vitamin D level Labs with her gynecologist showed low vitamin D, she has not yet started supplementation, we can recheck this in  September.   ___________________________________________ Ihor Austin. Benjamin Stain, M.D., ABFM., CAQSM. Primary Care and Sports Medicine [facility]  Adjunct Professor of Family Medicine  [employer]

## 2021-03-17 NOTE — Assessment & Plan Note (Signed)
Labs with her gynecologist showed low vitamin D, she has not yet started supplementation, we can recheck this in September.

## 2021-03-17 NOTE — Assessment & Plan Note (Signed)
A1c was elevated in the past, she did have gestational diabetes. Hemoglobin A1c is 5.6% today, no further intervention needed.

## 2021-04-07 ENCOUNTER — Emergency Department (INDEPENDENT_AMBULATORY_CARE_PROVIDER_SITE_OTHER)
Admission: EM | Admit: 2021-04-07 | Discharge: 2021-04-07 | Disposition: A | Discharge status: Home / Self Care | Payer: 59 | Source: Home / Self Care | Attending: Family Medicine | Admitting: Family Medicine

## 2021-04-07 ENCOUNTER — Encounter: Payer: Self-pay | Admitting: Emergency Medicine

## 2021-04-07 DIAGNOSIS — H6121 Impacted cerumen, right ear: Secondary | ICD-10-CM

## 2021-04-07 NOTE — ED Triage Notes (Signed)
Patient c/o right ear blockage x 4-5 days.  No other sx's.

## 2021-04-07 NOTE — ED Provider Notes (Signed)
[name]    CSN: [id number] Arrival date & time: 04/07/21  6295      History   Chief Complaint Chief Complaint  Patient presents with  . Right Ear Clogged    HPI Michele Patterson is a 35 y.o. female.   Patient complains of 4-5 day history of right ear feeling clogged without pain.  She feels well otherwise without fevers, chills, and sweats, URI symptoms, pain when chewing, etc.  The history is provided by the patient.    Past Medical History:  Diagnosis Date  . Anemia   . Diabetes mellitus without complication (HCC)   . Elevated hemoglobin A1c 04/29/2018  . Gestational diabetes   . High triglycerides 04/29/2018  . Irregular periods/menstrual cycles   . Low hemoglobin 04/29/2018  . PCOS (polycystic ovarian syndrome)   . Runner's knee   . Vitamin D deficiency 04/29/2018    Patient Active Problem List   Diagnosis Date Noted  . Low vitamin D level 03/17/2021  . 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 Low hemoglobin 04/29/2018  . Elevated hemoglobin A1c 04/29/2018  . High triglycerides 04/29/2018  . Grieving 11/07/2017  . Tension headache 11/07/2017  . GDM, class A1 09/09/2017  . Cesarean delivery delivered / Indication: failure to descend 09/08/2017  . Postpartum care following cesarean delivery 9/29 09/08/2017  . Annual physical exam 11/30/2015  . 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 Family planning 10/25/2014  . PCOS (polycystic ovarian syndrome) 10/25/2014    Past Surgical History:  Procedure Laterality Date  . CESAREAN SECTION N/A 09/07/2017   Procedure: CESAREAN SECTION;  Surgeon: Shea Evans, MD;  Location: [facility] BIRTHING SUITES;  Service: Obstetrics;  Laterality: N/A;    OB History    Gravida  2   Para  1   Term  1   Preterm  0   AB  1   Living  1     SAB  1   IAB  0   Ectopic  0   Multiple      Live Births  1            Home Medications    Prior to Admission medications   Medication Sig Start  Date End Date Taking? Authorizing Provider  Vitamin D, Ergocalciferol, (DRISDOL) 1.25 MG (50000 UNIT) CAPS capsule Take 1 capsule (50,000 Units total) by mouth every 7 (seven) days. Take for 8 total doses(weeks) 01/03/20   Monica Becton, MD    Family History Family History  Problem Relation Age of Onset  . Heart attack Father   . Diabetes Father   . Cancer Mother 62       breast  . Diabetes Paternal Grandfather     Social History Social History   Tobacco Use  . Smoking status: Never Smoker  . Smokeless tobacco: Never Used  Vaping Use  . Vaping Use: Never used  Substance Use Topics  . Alcohol use: No  . Drug use: No     Allergies   Patient has no known allergies.   Review of Systems Review of Systems No sore throat No cough No pleuritic pain No wheezing No nasal congestion No post-nasal drainage No sinus pain/pressure No itchy/red eyes No earache, but right ear feels clogged No hemoptysis No SOB No fever/chills No nausea No vomiting No abdominal pain No diarrhea No urinary symptoms No skin rash No fatigue No myalgias No headache   Physical Exam Triage Vital Signs ED Triage Vitals  Enc Vitals Group  BP 04/07/21 0945 122/82     Pulse Rate 04/07/21 0945 92     Resp --      Temp 04/07/21 0945 99 F (37.2 C)     Temp Source 04/07/21 0945 Oral     SpO2 04/07/21 0945 99 %     Weight --      Height --      Head Circumference --      Peak Flow --      Pain Score 04/07/21 0946 0     Pain Loc --      Pain Edu? --      Excl. in GC? --    No data found.  Updated Vital Signs BP 122/82 (BP Location: Left Arm)   Pulse 92   Temp 99 F (37.2 C) (Oral)   LMP 03/27/2021   SpO2 99%   Visual Acuity Right Eye Distance:   Left Eye Distance:   Bilateral Distance:    Right Eye Near:   Left Eye Near:    Bilateral Near:     Physical Exam Vitals and nursing note reviewed.  Constitutional:      General: She is not in acute distress. HENT:      Head: Normocephalic.     Right Ear: External ear normal. There is impacted cerumen.     Left Ear: Tympanic membrane, ear canal and external ear normal. There is no impacted cerumen.     Ears:     Comments: Post right ear lavage, right canal and tympanic membrane appear normal.    Nose: Nose normal.     Mouth/Throat:     Mouth: Mucous membranes are moist.     Pharynx: Oropharynx is clear. No posterior oropharyngeal erythema.  Eyes:     Pupils: Pupils are equal, round, and reactive to light.  Cardiovascular:     Rate and Rhythm: Normal rate.  Pulmonary:     Effort: Pulmonary effort is normal.  Musculoskeletal:     Cervical back: Neck supple.  Lymphadenopathy:     Cervical: No cervical adenopathy.  Skin:    General: Skin is warm and dry.  Neurological:     Mental Status: She is alert and oriented to person, place, and time.      UC Treatments / Results  Labs (all labs ordered are listed, but only abnormal results are displayed) Labs Reviewed - No data to display  EKG   Radiology No results found.  Procedures Procedures (including critical care time)  Medications Ordered in UC Medications - No data to display  Initial Impression / Assessment and Plan / UC Course  I have reviewed the triage vital signs and the nursing notes.  Pertinent labs & imaging results that were available during my care of the patient were reviewed by me and considered in my medical decision making (see chart for details).    No evidence otitis media or otitis externa   Final Clinical Impressions(s) / UC Diagnoses   Final diagnoses:  Impacted cerumen of right ear     Discharge Instructions      To prevent recurrent ear wax blockage, try the following: Soak two cotton balls with mineral oil, and gently place in each ear canal once weekly.  Leave the cotton balls in place for 10 to 20 minutes.  This will help liquefy the ear wax and aid your body's normal elimination process.  If  applicable, do not use a hearing aid for 8 hours overnight.  Have your ears  cleaned by a health professional every 6 to 12 months.  Avoid using "Q-tips" and ear wax softening solutions     ED Prescriptions    None        Lattie Haw, MD 04/07/21 1019

## 2021-04-07 NOTE — Discharge Instructions (Signed)
To prevent recurrent ear wax blockage, try the following: Soak two cotton balls with mineral oil, and gently place in each ear canal once weekly.  Leave the cotton balls in place for 10 to 20 minutes.  This will help liquefy the ear wax and aid your body's normal elimination process.  If applicable, do not use a hearing aid for 8 hours overnight.  Have your ears cleaned by a health professional every 6 to 12 months.  Avoid using "Q-tips" and ear wax softening solutions  

## 2021-05-26 ENCOUNTER — Emergency Department (INDEPENDENT_AMBULATORY_CARE_PROVIDER_SITE_OTHER)
Admission: EM | Admit: 2021-05-26 | Discharge: 2021-05-26 | Disposition: A | Discharge status: Home / Self Care | Payer: 59 | Source: Home / Self Care | Attending: Family Medicine | Admitting: Family Medicine

## 2021-05-26 ENCOUNTER — Inpatient Hospital Stay
Admission: RE | Admit: 2021-05-26 | Discharge: 2021-05-26 | Disposition: A | Discharge status: Home / Self Care | Payer: Self-pay | Source: Ambulatory Visit

## 2021-05-26 ENCOUNTER — Other Ambulatory Visit: Payer: Self-pay

## 2021-05-26 ENCOUNTER — Encounter: Payer: Self-pay | Admitting: Emergency Medicine

## 2021-05-26 DIAGNOSIS — R1030 Lower abdominal pain, unspecified: Secondary | ICD-10-CM | POA: Diagnosis not present

## 2021-05-26 LAB — POCT URINALYSIS DIP (MANUAL ENTRY)
Bilirubin, UA: NEGATIVE
Glucose, UA: NEGATIVE mg/dL
Ketones, POC UA: NEGATIVE mg/dL
Leukocytes, UA: NEGATIVE
Nitrite, UA: NEGATIVE
Protein Ur, POC: NEGATIVE mg/dL
Spec Grav, UA: 1.015 (ref 1.010–1.025)
Urobilinogen, UA: 0.2 E.U./dL
pH, UA: 6 (ref 5.0–8.0)

## 2021-05-26 LAB — POCT URINE PREGNANCY: Preg Test, Ur: NEGATIVE

## 2021-05-26 NOTE — ED Provider Notes (Signed)
[facility]    CSN: [id number] Arrival date & time: 05/26/21  1458      History   Chief Complaint Chief Complaint  Patient presents with   Abdominal Cramping    HPI Michele Patterson is a 35 y.o. female.    Patient reports that she had some loose stools yesterday and this morning.  Today she has had vague crampy abdominal discomfort and bloating.  Her bowel movements had been normal prior to yesterday.  She denies urinary symptoms, nausea/vomiting and fevers, chills, and sweats.  Denies recent foreign travel, or drinking untreated water in a wilderness environment.  She denies recent antibiotic use. Patient's last menstrual period was 04/29/2021 (exact date).    The history is provided by the patient.  Abdominal Pain Pain location:  Generalized Pain quality: bloating and fullness   Pain radiates to:  Does not radiate Pain severity:  Mild Onset quality:  Sudden Duration:  9 hours Timing:  Intermittent Progression:  Improving Chronicity:  New Context: eating and previous surgery   Context: not awakening from sleep, not diet changes, not laxative use, not recent illness, not recent travel, not sick contacts and not suspicious food intake   Relieved by:  None tried Worsened by:  Nothing Ineffective treatments:  None tried Associated symptoms: anorexia, diarrhea and fatigue   Associated symptoms: no belching, no chest pain, no chills, no constipation, no cough, no dysuria, no fever, no flatus, no hematemesis, no hematochezia, no hematuria, no melena, no nausea, no shortness of breath, no vaginal bleeding, no vaginal discharge and no vomiting    Past Medical History:  Diagnosis Date   Anemia    Diabetes mellitus without complication (HCC)    Elevated hemoglobin A1c 04/29/2018   Gestational diabetes    High triglycerides 04/29/2018   Irregular periods/menstrual cycles    Low hemoglobin 04/29/2018   PCOS (polycystic ovarian syndrome)    Runner's knee     Vitamin D deficiency 04/29/2018    Patient Active Problem List   Diagnosis Date Noted   Low vitamin D level 03/17/2021   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   Low hemoglobin 04/29/2018   Elevated hemoglobin A1c 04/29/2018   High triglycerides 04/29/2018   Grieving 11/07/2017   Tension headache 11/07/2017   GDM, class A1 09/09/2017   Cesarean delivery delivered / Indication: failure to descend 09/08/2017   Postpartum care following cesarean delivery 9/29 09/08/2017   Annual physical exam 11/30/2015   Patellofemoral syndrome, 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 Authorizing Provider  Vitamin D, Ergocalciferol, (DRISDOL) 1.25 MG (50000 UNIT) CAPS capsule Take 1 capsule (50,000 Units total) by mouth every 7 (seven) days. Take for 8 total doses(weeks)  Patient not taking: Reported on 05/26/2021 01/03/20   Monica Becton, MD    Family History Family History  Problem Relation Age of Onset   Heart attack Father    Diabetes Father    Cancer Mother 31       breast   Diabetes Paternal Grandfather     Social History Social History   Tobacco Use   Smoking status: Never   Smokeless tobacco: Never  Vaping Use   Vaping Use: Never used  Substance Use Topics   Alcohol use: No   Drug use: No     Allergies   Patient has no known allergies.   Review of Systems Review of Systems  Constitutional:  Positive for appetite  change and fatigue. Negative for activity change, chills, diaphoresis and fever.  HENT: Negative.    Eyes: Negative.   Respiratory:  Negative for cough and shortness of breath.   Cardiovascular:  Negative for chest pain.  Gastrointestinal:  Positive for abdominal pain, anorexia and diarrhea. Negative for blood in stool, constipation, flatus, hematemesis, hematochezia, melena, nausea and vomiting.  Genitourinary:  Negative for dysuria, hematuria, vaginal bleeding and vaginal discharge.  Musculoskeletal: Negative.   Skin: Negative.   Neurological:  Negative for headaches.    Physical Exam Triage Vital Signs ED Triage Vitals  Enc Vitals Group     BP 05/26/21 1715 121/89     Pulse Rate 05/26/21 1715 65     Resp 05/26/21 1715 16     Temp 05/26/21 1715 99.1 F (37.3 C)     Temp Source 05/26/21 1715 Oral     SpO2 05/26/21 1715 95 %     Weight 05/26/21 1718 124 lb (56.2 kg)     Height 05/26/21 1718 5\' 2"  (1.575 m)     Head Circumference --      Peak Flow --      Pain Score 05/26/21 1717 2     Pain Loc --      Pain Edu? --      Excl. in GC? --    No data found.  Updated Vital Signs BP 121/89 (BP Location: Right Arm)   Pulse 65   Temp 99.1 F (37.3 C) (Oral)   Resp 16   Ht 5\' 2"  (1.575 m)   Wt 56.2 kg   LMP 04/29/2021 (Exact Date)   SpO2 95%   BMI 22.68 kg/m   Visual Acuity Right Eye Distance:   Left Eye Distance:   Bilateral Distance:    Right Eye Near:   Left Eye Near:    Bilateral Near:     Physical Exam Vitals and nursing note reviewed.  Constitutional:      General: She is not in acute distress.    Appearance: She is not ill-appearing.  HENT:     Head: Normocephalic.     Nose: Nose normal.     Mouth/Throat:     Mouth: Mucous membranes are moist.  Eyes:     Pupils: Pupils are equal, round, and reactive to light.  Cardiovascular:     Rate and Rhythm: Normal rate and regular rhythm.     Heart sounds: Normal heart sounds.  Pulmonary:     Breath sounds:  Normal breath sounds.  Abdominal:     General: Abdomen is flat. There is no distension.     Palpations: Abdomen is soft. There is no mass.     Tenderness: There is no abdominal tenderness. There is no right CVA tenderness, left CVA  tenderness or guarding.  Musculoskeletal:     Cervical back: Neck supple.     Right lower leg: No edema.     Left lower leg: No edema.  Lymphadenopathy:     Cervical: No cervical adenopathy.  Skin:    General: Skin is warm and dry.     Findings: No rash.  Neurological:     Mental Status: She is alert and oriented to person, place, and time.     UC Treatments / Results  Labs (all labs ordered are listed, but only abnormal results are displayed) Labs Reviewed  POCT URINALYSIS DIP (MANUAL ENTRY) - Abnormal; Notable for the following components:      Result Value   Blood, UA trace-intact (*)    All other components within normal limits  CBC WITH DIFFERENTIAL/PLATELET  POCT URINE PREGNANCY negative    EKG   Radiology No results found.  Procedures Procedures (including critical care time)  Medications Ordered in UC Medications - No data to display  Initial Impression / Assessment and Plan / UC Course  I have reviewed the triage vital signs and the nursing notes.  Pertinent labs & imaging results that were available during my care of the patient were reviewed by me and considered in my medical decision making (see chart for details).    Benign exam.  Suspect viral syndrome.  As a precaution check CBC; will need re-check if WBC elevated.  Final Clinical Impressions(s) / UC Diagnoses   Final diagnoses:  Lower abdominal pain     Discharge Instructions      Begin Pedialyte for about 12 hours until diarrhea stops, then switch to clear liquids (apple juice, clear grape juice, Jello, etc) for about 6 to 12 hours.  When improved, advance to a [employer] (Bananas, Rice, Applesauce, Toast). Then gradually resume a regular diet when tolerated.   Avoid milk products until well.     If symptoms become significantly worse during the night or over the weekend, proceed to the local emergency room.      ED Prescriptions   None       Lattie Haw, MD 05/28/21 1353

## 2021-05-26 NOTE — Discharge Instructions (Signed)
Begin Pedialyte for about 12 hours until diarrhea stops, then switch to clear liquids (apple juice, clear grape juice, Jello, etc) for about 6 to 12 hours.  When improved, advance to a [employer] (Bananas, Rice, Applesauce, Toast). Then gradually resume a regular diet when tolerated.  Avoid milk products until well.     If symptoms become significantly worse during the night or over the weekend, proceed to the local emergency room.

## 2021-05-26 NOTE — ED Triage Notes (Signed)
Lower adominal pain since this morning   - loose stools yesterday & this morning Unable to have a BM on arrival  Denies fever or chills  COVID vaccine + booster Pfizer 12/21 HX of PCOS

## 2021-05-27 ENCOUNTER — Telehealth: Payer: Self-pay

## 2021-05-27 LAB — CBC WITH DIFFERENTIAL/PLATELET
Absolute Monocytes: 562 cells/uL (ref 200–950)
Basophils Absolute: 37 cells/uL (ref 0–200)
Basophils Relative: 0.5 %
Eosinophils Absolute: 197 cells/uL (ref 15–500)
Eosinophils Relative: 2.7 %
HCT: 42.8 % (ref 35.0–45.0)
Hemoglobin: 14.3 g/dL (ref 11.7–15.5)
Lymphs Abs: 2387 cells/uL (ref 850–3900)
MCH: 28.8 pg (ref 27.0–33.0)
MCHC: 33.4 g/dL (ref 32.0–36.0)
MCV: 86.3 fL (ref 80.0–100.0)
MPV: 9.7 fL (ref 7.5–12.5)
Monocytes Relative: 7.7 %
Neutro Abs: 4117 cells/uL (ref 1500–7800)
Neutrophils Relative %: 56.4 %
Platelets: 426 10*3/uL — ABNORMAL HIGH (ref 140–400)
RBC: 4.96 10*6/uL (ref 3.80–5.10)
RDW: 12.3 % (ref 11.0–15.0)
Total Lymphocyte: 32.7 %
WBC: 7.3 10*3/uL (ref 3.8–10.8)

## 2021-05-27 NOTE — Telephone Encounter (Signed)
Pt called re lab results. Informed labs are normal ranges. No concerns as long as improving each day. Continue BRAT diet and f/u if not better by mid next week.

## 2021-10-03 ENCOUNTER — Other Ambulatory Visit: Payer: Self-pay

## 2021-10-03 ENCOUNTER — Emergency Department (INDEPENDENT_AMBULATORY_CARE_PROVIDER_SITE_OTHER)
Admission: EM | Admit: 2021-10-03 | Discharge: 2021-10-03 | Disposition: A | Discharge status: Home / Self Care | Payer: 59 | Source: Home / Self Care | Attending: Family Medicine | Admitting: Family Medicine

## 2021-10-03 ENCOUNTER — Emergency Department: Admit: 2021-10-03 | Discharge status: Absent without leave | Payer: Self-pay

## 2021-10-03 DIAGNOSIS — J101 Influenza due to other identified influenza virus with other respiratory manifestations: Secondary | ICD-10-CM

## 2021-10-03 LAB — POC INFLUENZA A AND B ANTIGEN (URGENT CARE ONLY)
Influenza A Ag: POSITIVE — AB
Influenza B Ag: NEGATIVE

## 2021-10-03 MED ORDER — OSELTAMIVIR PHOSPHATE 75 MG PO CAPS: 75.0000 mg | ORAL_CAPSULE | Freq: Two times a day (BID) | ORAL | 0 refills | Status: DC

## 2021-10-03 MED ORDER — ACETAMINOPHEN 325 MG PO TABS
650.0000 mg | ORAL_TABLET | Freq: Once | ORAL | Status: AC
  Administered 2021-10-03: 650 mg via ORAL

## 2021-10-03 NOTE — ED Triage Notes (Signed)
Pt c/o fever, cough, nasal congestion since Sunday. Kid sick with flu last weekend. Tylenol prn. None today.

## 2021-10-03 NOTE — ED Provider Notes (Signed)
[name]    CSN: [id number] Arrival date & time: 10/03/21  1221      History   Chief Complaint Chief Complaint  Patient presents with   Fever   Cough   Sore Throat    HPI Michele Patterson is a 35 y.o. female.   HPI  Child has influenza.  This patient has been sick for 2 days.  Temperature is 103.1, body aches, sore throat, extreme fatigue.  Mild cough.  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   Low hemoglobin 04/29/2018   Elevated hemoglobin A1c 04/29/2018   High triglycerides 04/29/2018   Grieving 11/07/2017   Tension headache 11/07/2017   GDM, class A1 09/09/2017   Cesarean delivery delivered / Indication: failure to descend 09/08/2017   Postpartum care following cesarean delivery 9/29 09/08/2017   Annual physical exam 11/30/2015   Patellofemoral syndrome, right 11/30/2015   Family planning 10/25/2014   PCOS (polycystic ovarian syndrome) 10/25/2014    Past Surgical History:  Procedure Laterality Date   CESAREAN SECTION N/A 09/07/2017   Procedure: CESAREAN SECTION;  Surgeon: Shea Evans, MD;  Location: [facility] BIRTHING SUITES;  Service: Obstetrics;  Laterality: N/A;    OB History     Gravida  2   Para  1   Term  1   Preterm  0   AB  1   Living  1      SAB  1   IAB  0   Ectopic  0   Multiple      Live Births  1            Home Medications    Prior to Admission medications   Medication Sig Start Date End Date Taking? Authorizing Provider  oseltamivir  (TAMIFLU) 75 MG capsule Take 1 capsule (75 mg total) by mouth every 12 (twelve) hours. 10/03/21  Yes Eustace Moore, MD  Vitamin D, Ergocalciferol, (DRISDOL) 1.25 MG (50000 UNIT) CAPS capsule Take 1 capsule (50,000 Units total) by mouth every 7 (seven) days. Take for 8 total doses(weeks) Patient not taking: Reported on 05/26/2021 01/03/20   Monica Becton, MD    Family History Family History  Problem Relation Age of Onset   Heart attack Father    Diabetes Father    Cancer Mother 40       breast   Diabetes Paternal Grandfather     Social History Social History   Tobacco Use   Smoking status: Never   Smokeless tobacco: Never  Vaping Use   Vaping Use: Never used  Substance Use Topics   Alcohol use: No   Drug use: No     Allergies   Patient has no known allergies.   Review of Systems Review of Systems See HPI  Physical Exam Triage Vital Signs ED Triage Vitals  Enc Vitals Group     BP 10/03/21 1243 122/72     Pulse Rate 10/03/21 1243 (!) 124     Resp 10/03/21 1243 18  Temp 10/03/21 1243 (!) 103.1 F (39.5 C)     Temp Source 10/03/21 1243 Oral     SpO2 10/03/21 1243 96 %     Weight --      Height --      Head Circumference --      Peak Flow --      Pain Score 10/03/21 1244 9     Pain Loc --      Pain Edu? --      Excl. in GC? --    No data found.  Updated Vital Signs BP 122/72 (BP Location: Right Arm)   Pulse (!) 124   Temp (!) 103.1 F (39.5 C) (Oral)   Resp 18   SpO2 96%      Physical Exam Constitutional:      General: She is not in acute distress.    Appearance: She is well-developed. She is ill-appearing.  HENT:     Head: Normocephalic and atraumatic.     Mouth/Throat:     Comments: Mask is in place Eyes:     Conjunctiva/sclera: Conjunctivae normal.     Pupils: Pupils are equal, round, and reactive to light.  Cardiovascular:     Rate and Rhythm: Normal rate and regular rhythm.  Pulmonary:     Effort: Pulmonary effort is  normal. No respiratory distress.     Breath sounds: Normal breath sounds.  Abdominal:     General: There is no distension.     Palpations: Abdomen is soft.  Musculoskeletal:        General: Normal range of motion.     Cervical back: Normal range of motion.  Skin:    General: Skin is warm and dry.  Neurological:     Mental Status: She is alert.     UC Treatments / Results  Labs (all labs ordered are listed, but only abnormal results are displayed) Labs Reviewed  POC INFLUENZA A AND B ANTIGEN (URGENT CARE ONLY) - Abnormal; Notable for the following components:      Result Value   Influenza A Ag Positive (*)    All other components within normal limits    EKG   Radiology No results found.  Procedures Procedures (including critical care time)  Medications Ordered in UC Medications  acetaminophen (TYLENOL) tablet 650 mg (650 mg Oral Given 10/03/21 1254)    Initial Impression / Assessment and Plan / UC Course  I have reviewed the triage vital signs and the nursing notes.  Pertinent labs & imaging results that were available during my care of the patient were reviewed by me and considered in my medical decision making (see chart for details).     Final Clinical Impressions(s) / UC Diagnoses   Final diagnoses:  Influenza A     Discharge Instructions      Try to drink plenty of fluids Take acetaminophen or ibuprofen for pain and fever May take other cough and cold medications if needed Take the Tamiflu 2 times a day for 5 days Stay home.  Rest. Call for problems   ED Prescriptions     Medication Sig Dispense Auth. Provider   oseltamivir (TAMIFLU) 75 MG capsule Take 1 capsule (75 mg total) by mouth every 12 (twelve) hours. 10 capsule Eustace Moore, MD      PDMP not reviewed this encounter.   Eustace Moore, MD 10/03/21 2009

## 2021-10-03 NOTE — Discharge Instructions (Addendum)
Try to drink plenty of fluids Take acetaminophen or ibuprofen for pain and fever May take other cough and cold medications if needed Take the Tamiflu 2 times a day for 5 days Stay home.  Rest. Call for problems

## 2022-06-01 ENCOUNTER — Emergency Department (INDEPENDENT_AMBULATORY_CARE_PROVIDER_SITE_OTHER)
Admission: EM | Admit: 2022-06-01 | Discharge: 2022-06-01 | Disposition: A | Discharge status: Home / Self Care | Payer: 59 | Source: Home / Self Care

## 2022-06-01 DIAGNOSIS — H60391 Other infective otitis externa, right ear: Secondary | ICD-10-CM

## 2022-06-01 MED ORDER — CEPHALEXIN 500 MG PO CAPS: 500.0000 mg | ORAL_CAPSULE | Freq: Four times a day (QID) | ORAL | 0 refills | Status: DC

## 2022-06-01 NOTE — ED Provider Notes (Signed)
[name]    CSN: [id number] Arrival date & time: 06/01/22  1933      History   Chief Complaint Chief Complaint  Patient presents with   Ear Problem    Pain behind RT ear    HPI Michele Patterson is a 36 y.o. female.   Patient presents with concerns of right ear pain and swelling. She reports earlier this week she had put in a pair of earrings and left them in for about 3 days. When she went to change them yesterday she noticed pain and swelling to the right ear piercing. She has since noticed some tender swelling underneath her ear and discomfort into the side of her neck. She states the piercing has had some drainage. The patient reports she had some cold symptoms last week that have resolved. She denies pain to the inner ear, fever, dizziness, or difficulty swallowing. She has not taken or tried anything for her symptoms.   The history is provided by the patient.    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   Low hemoglobin 04/29/2018   Elevated hemoglobin A1c 04/29/2018   High triglycerides 04/29/2018   Grieving 11/07/2017   Tension headache 11/07/2017   GDM, class A1 09/09/2017   Cesarean delivery delivered / Indication: failure to descend 09/08/2017   Postpartum care following cesarean delivery 9/29 09/08/2017   Annual physical exam 11/30/2015   Patellofemoral syndrome, right 11/30/2015   Family planning 10/25/2014   PCOS (polycystic ovarian syndrome) 10/25/2014    Past Surgical History:  Procedure Laterality Date    CESAREAN SECTION N/A 09/07/2017   Procedure: CESAREAN SECTION;  Surgeon: Shea Evans, MD;  Location: [facility] BIRTHING SUITES;  Service: Obstetrics;  Laterality: N/A;    OB History     Gravida  2   Para  1   Term  1   Preterm  0   AB  1   Living  1      SAB  1   IAB  0   Ectopic  0   Multiple      Live Births  1            Home Medications    Prior to Admission medications   Medication Sig Start Date End Date Taking? Authorizing Provider  cephALEXin (KEFLEX) 500 MG capsule Take 1 capsule (500 mg total) by mouth 4 (four) times daily. 06/01/22  Yes Novah Goza L, PA  oseltamivir (TAMIFLU) 75 MG capsule Take 1 capsule (75 mg total) by mouth every 12 (twelve) hours. 10/03/21   Eustace Moore, MD  Vitamin D, Ergocalciferol, (DRISDOL) 1.25 MG (50000 UNIT) CAPS capsule Take 1 capsule (50,000 Units total) by mouth every 7 (seven) days. Take for 8 total doses(weeks) Patient not taking: Reported on 05/26/2021 01/03/20   Monica Becton, MD    Family History Family History  Problem Relation Age of Onset   Heart attack Father    Diabetes Father    Cancer Mother 102  breast   Diabetes Paternal Grandfather     Social History Social History   Tobacco Use   Smoking status: Never   Smokeless tobacco: Never  Vaping Use   Vaping Use: Never used  Substance Use Topics   Alcohol use: No   Drug use: No     Allergies   Patient has no known allergies.   Review of Systems Review of Systems  Constitutional:  Negative for fatigue and fever.  HENT:  Positive for ear discharge and ear pain. Negative for congestion, sore throat and trouble swallowing.   Respiratory:  Negative for cough.   Cardiovascular:  Negative for chest pain.  Gastrointestinal:  Negative for nausea.  Musculoskeletal:  Positive for neck pain.  Skin:  Negative for rash.  Neurological:  Negative for dizziness and headaches.     Physical Exam Triage Vital Signs ED Triage Vitals  Enc  Vitals Group     BP 06/01/22 1941 111/76     Pulse Rate 06/01/22 1941 100     Resp 06/01/22 1941 18     Temp 06/01/22 1941 98.5 F (36.9 C)     Temp Source 06/01/22 1941 Oral     SpO2 06/01/22 1941 99 %     Weight --      Height --      Head Circumference --      Peak Flow --      Pain Score 06/01/22 1942 3     Pain Loc --      Pain Edu? --      Excl. in GC? --    No data found.  Updated Vital Signs BP 111/76 (BP Location: Right Arm)   Pulse 100   Temp 98.5 F (36.9 C) (Oral)   Resp 18   LMP 05/15/2022   SpO2 99%   Visual Acuity Right Eye Distance:   Left Eye Distance:   Bilateral Distance:    Right Eye Near:   Left Eye Near:    Bilateral Near:     Physical Exam Vitals and nursing note reviewed.  Constitutional:      General: She is not in acute distress. HENT:     Right Ear: Tympanic membrane and ear canal normal. Swelling and tenderness present.     Left Ear: Tympanic membrane, ear canal and external ear normal.     Ears:     Comments: R earlobe piercing with posterior lobe with swelling, tenderness, erythema, and purulent drainage from piercing. Right posterior cervical lymph node with swelling and tenderness. No mastoid tenderness or tenderness with pinna manipulation.  Eyes:     Conjunctiva/sclera: Conjunctivae normal.     Pupils: Pupils are equal, round, and reactive to light.  Cardiovascular:     Rate and Rhythm: Normal rate and regular rhythm.     Heart sounds: Normal heart sounds.  Pulmonary:     Effort: Pulmonary effort is normal.     Breath sounds: Normal breath sounds.  Musculoskeletal:     Cervical back: Normal range of motion.  Neurological:     Mental Status: She is alert.  Psychiatric:        Mood and Affect: Mood normal.      UC Treatments / Results  Labs (all labs ordered are listed, but only abnormal results are displayed) Labs Reviewed - No data to display  EKG   Radiology No results found.  Procedures Procedures  (including critical care time)  Medications Ordered in UC Medications - No data to  display  Initial Impression / Assessment and Plan / UC Course  I have reviewed the triage vital signs and the nursing notes.  Pertinent labs & imaging results that were available during my care of the patient were reviewed by me and considered in my medical decision making (see chart for details).     Earlobe piercing infection with reactive LAD. Empiric abx and discussed wound care including avoiding wearing an earring to allow proper healing and ways to avoid contamination.   E/M: 1 acute uncomplicated illness, no data, moderate risk due to prescription management  Final Clinical Impressions(s) / UC Diagnoses   Final diagnoses:  Infection of right ear lobe     Discharge Instructions      Take antibiotics as prescribed. Recommend not putting in an earring for at least a week or until the area is healed. Gently clean the area with soap and water or alcohol and clean the earring well before putting it back in. Follow-up with PCP if persistent symptoms despite antibiotics.     ED Prescriptions     Medication Sig Dispense Auth. Provider   cephALEXin (KEFLEX) 500 MG capsule Take 1 capsule (500 mg total) by mouth 4 (four) times daily. 20 capsule Vallery Sa, Tira Lafferty L, PA      PDMP not reviewed this encounter.   Estanislado Pandy, [place] 06/01/22 [phone]

## 2022-06-01 NOTE — ED Triage Notes (Signed)
Pt c/o pain behind RT ear since yesterday. States it is tender to touch. States last week she had a cold but sxs resolved. Pain 3/10

## 2022-09-20 ENCOUNTER — Telehealth: Payer: Self-pay | Admitting: Sports Medicine

## 2022-09-20 DIAGNOSIS — R7989 Other specified abnormal findings of blood chemistry: Secondary | ICD-10-CM

## 2022-09-20 DIAGNOSIS — R7309 Other abnormal glucose: Secondary | ICD-10-CM

## 2022-09-20 NOTE — Telephone Encounter (Signed)
Patient schedule physical for 10/23 wants to come in earlier for lab work . Can you place order for lab work.  

## 2022-09-20 NOTE — Telephone Encounter (Signed)
Labs ordered.

## 2022-10-01 ENCOUNTER — Encounter: Payer: Self-pay | Admitting: Sports Medicine

## 2022-10-01 ENCOUNTER — Ambulatory Visit (INDEPENDENT_AMBULATORY_CARE_PROVIDER_SITE_OTHER): Payer: 59 | Admitting: Sports Medicine

## 2022-10-01 VITALS — BP 116/80 | HR 82 | Wt 126.0 lb

## 2022-10-01 DIAGNOSIS — M222X1 Patellofemoral disorders, right knee: Secondary | ICD-10-CM

## 2022-10-01 DIAGNOSIS — Z0000 Encounter for general adult medical examination without abnormal findings: Secondary | ICD-10-CM | POA: Diagnosis not present

## 2022-10-01 MED ORDER — ATOVAQUONE-PROGUANIL HCL 250-100 MG PO TABS: 1.0000 | ORAL_TABLET | Freq: Every day | ORAL | 0 refills | Status: DC

## 2022-10-01 MED ORDER — TYPHOID VACCINE PO CPDR: 1.0000 | DELAYED_RELEASE_CAPSULE | ORAL | 0 refills | Status: DC

## 2022-10-01 NOTE — Assessment & Plan Note (Addendum)
Annual physical as above, they will come back tomorrow morning for routine labs. Up-to-date on cervical cancer screening, it sounds like she had HPV and Pap done about 2 years ago at [facility], they will let me know the date. They will get their flu shot when they come back from [place].

## 2022-10-01 NOTE — Progress Notes (Signed)
Subjective:    CC: Annual Physical Exam  HPI:  This patient is here for their annual physical  I reviewed the past medical history, family history, social history, surgical history, and allergies today and no changes were needed.  Please see the problem list section below in epic for further details.  Past Medical History: Past Medical History:  Diagnosis Date   Anemia    Diabetes mellitus without complication (HCC)    Elevated hemoglobin A1c 04/29/2018   Gestational diabetes    High triglycerides 04/29/2018   Irregular periods/menstrual cycles    Low hemoglobin 04/29/2018   PCOS (polycystic ovarian syndrome)    Runner's knee    Vitamin D deficiency 04/29/2018   Past Surgical History: Past Surgical History:  Procedure Laterality Date   CESAREAN SECTION N/A 09/07/2017   Procedure: CESAREAN SECTION;  Surgeon: Shea Evans, MD;  Location: [facility] BIRTHING SUITES;  Service: Obstetrics;  Laterality: N/A;   Social History: Social History   Socioeconomic History   Marital status: Married    Spouse name: Not on file   Number of children: Not on file   Years of education: Not on file   Highest education level: Not on file  Occupational History   Not on file  Tobacco Use   Smoking status: Never   Smokeless tobacco: Never  Vaping Use   Vaping Use: Never used  Substance and Sexual Activity   Alcohol use: No   Drug use: No   Sexual activity: Yes  Other Topics Concern   Not on file  Social History Narrative   ** Merged History Encounter **       Social Determinants of Health   Financial Resource Strain: Not on file  Food Insecurity: Not on file  Transportation Needs: Not on file  Physical Activity: Not on file  Stress: Not on file  Social Connections: Not on file   Family History: Family History  Problem Relation Age of Onset   Heart attack Father    Diabetes Father    Cancer Mother 104       breast   Diabetes Paternal Grandfather    Allergies: No Known  Allergies Medications: See med rec.  Review of Systems: No headache, visual changes, nausea, vomiting, diarrhea, constipation, dizziness, abdominal pain, skin rash, fevers, chills, night sweats, swollen lymph nodes, weight loss, chest pain, body aches, joint swelling, muscle aches, shortness of breath, mood changes, visual or auditory hallucinations.  Objective:    General: Well Developed, well nourished, and in no acute distress.  Neuro: Alert and oriented x3, extra-ocular muscles intact, sensation grossly intact. Cranial nerves II through XII are intact, motor, sensory, and coordinative functions are all intact. HEENT: Normocephalic, atraumatic, pupils equal round reactive to light, neck supple, no masses, no lymphadenopathy, thyroid nonpalpable. Oropharynx, nasopharynx, external ear canals are unremarkable. Skin: Warm and dry, no rashes noted.  Cardiac: Regular rate and rhythm, no murmurs rubs or gallops.  Respiratory: Clear to auscultation bilaterally. Not using accessory muscles, speaking in full sentences.  Abdominal: Soft, nontender, nondistended, positive bowel sounds, no masses, no organomegaly.  Musculoskeletal: Shoulder, elbow, wrist, hip, knee, ankle stable, and with full range of motion.  Impression and Recommendations:    The patient was counselled, risk factors were discussed, anticipatory guidance given.  Annual physical exam Annual physical as above, they will come back tomorrow morning for routine labs. Up-to-date on cervical cancer screening, it sounds like she had HPV and Pap done about 2 years ago at [facility], they  will let me know the date. They will get their flu shot when they come back from [place].  Patellofemoral syndrome, right Reprinting her patellofemoral conditioning, she did notice a lot of improvement with weight loss. Return as needed for this.   ____________________________________________ Gwen Her. Dianah Field, M.D., ABFM., CAQSM.,  AME. Primary Care and Sports Medicine [facility]  Adjunct Professor of [employer] of [facility] of Medicine  Risk manager

## 2022-10-01 NOTE — Assessment & Plan Note (Signed)
Reprinting her patellofemoral conditioning, she did notice a lot of improvement with weight loss. Return as needed for this.

## 2022-10-03 LAB — LIPID PANEL
Cholesterol: 168 mg/dL (ref <200)
HDL: 55 mg/dL (ref >=50)
LDL Cholesterol (Calc): 97 mg/dL (calc)
Non-HDL Cholesterol (Calc): 113 mg/dL (calc) (ref <130)
Total CHOL/HDL Ratio: 3.1 (calc) (ref <5.0)
Triglycerides: 71 mg/dL (ref <150)

## 2022-10-03 LAB — COMPLETE METABOLIC PANEL WITH GFR
AG Ratio: 1.7 (calc) (ref 1.0–2.5)
ALT: 9 U/L (ref 6–29)
AST: 14 U/L (ref 10–30)
Albumin: 4.3 g/dL (ref 3.6–5.1)
Alkaline phosphatase (APISO): 36 U/L (ref 31–125)
BUN: 8 mg/dL (ref 7–25)
CO2: 28 mmol/L (ref 20–32)
Calcium: 9.2 mg/dL (ref 8.6–10.2)
Chloride: 102 mmol/L (ref 98–110)
Creat: 0.67 mg/dL (ref 0.50–0.97)
Globulin: 2.6 g/dL (calc) (ref 1.9–3.7)
Glucose, Bld: 96 mg/dL (ref 65–99)
Potassium: 4.3 mmol/L (ref 3.5–5.3)
Sodium: 137 mmol/L (ref 135–146)
Total Bilirubin: 0.7 mg/dL (ref 0.2–1.2)
Total Protein: 6.9 g/dL (ref 6.1–8.1)
eGFR: 116 mL/min/{1.73_m2} (ref >=60)

## 2022-10-03 LAB — CBC
HCT: 39.1 % (ref 35.0–45.0)
Hemoglobin: 13.1 g/dL (ref 11.7–15.5)
MCH: 30 pg (ref 27.0–33.0)
MCHC: 33.5 g/dL (ref 32.0–36.0)
MCV: 89.7 fL (ref 80.0–100.0)
MPV: 9.9 fL (ref 7.5–12.5)
Platelets: 311 Thousand/uL (ref 140–400)
RBC: 4.36 Million/uL (ref 3.80–5.10)
RDW: 12.6 % (ref 11.0–15.0)
WBC: 5.6 Thousand/uL (ref 3.8–10.8)

## 2022-10-03 LAB — HEMOGLOBIN A1C
Hgb A1c MFr Bld: 5.6 %{Hb} (ref <5.7)
Mean Plasma Glucose: 114 mg/dL
eAG (mmol/L): 6.3 mmol/L

## 2022-10-03 LAB — VITAMIN D 25 HYDROXY (VIT D DEFICIENCY, FRACTURES): Vit D, 25-Hydroxy: 12 ng/mL — ABNORMAL LOW (ref 30–100)

## 2022-10-03 LAB — TSH: TSH: 2.97 m[IU]/L

## 2022-10-03 MED ORDER — VITAMIN D (ERGOCALCIFEROL) 1.25 MG (50000 UNIT) PO CAPS: 50000.0000 [IU] | ORAL_CAPSULE | ORAL | 0 refills | Status: AC

## 2022-10-03 NOTE — Addendum Note (Signed)
Addended by: Silverio Decamp on: 10/03/2022 08:52 AM   Modules accepted: Orders

## 2023-01-22 ENCOUNTER — Encounter (INDEPENDENT_AMBULATORY_CARE_PROVIDER_SITE_OTHER): Payer: 59 | Admitting: Sports Medicine

## 2023-01-22 DIAGNOSIS — R8290 Unspecified abnormal findings in urine: Secondary | ICD-10-CM | POA: Diagnosis not present

## 2023-01-22 NOTE — Telephone Encounter (Signed)
I spent 5 total minutes of online digital evaluation and management services in this patient-initiated request for online care. 

## 2023-01-24 ENCOUNTER — Telehealth: Payer: 59 | Admitting: Sports Medicine

## 2024-03-10 LAB — HM PAP SMEAR: HM Pap smear: NORMAL

## 2024-03-10 LAB — RESULTS CONSOLE HPV: CHL HPV: NEGATIVE

## 2024-03-20 ENCOUNTER — Encounter: Payer: Self-pay | Admitting: Sports Medicine

## 2024-03-20 ENCOUNTER — Ambulatory Visit: Admitting: Sports Medicine

## 2024-03-20 ENCOUNTER — Ambulatory Visit (INDEPENDENT_AMBULATORY_CARE_PROVIDER_SITE_OTHER): Admitting: Sports Medicine

## 2024-03-20 VITALS — BP 100/68 | HR 79 | Resp 20 | Ht 62.0 in | Wt 130.8 lb

## 2024-03-20 DIAGNOSIS — E559 Vitamin D deficiency, unspecified: Secondary | ICD-10-CM

## 2024-03-20 DIAGNOSIS — Z0000 Encounter for general adult medical examination without abnormal findings: Secondary | ICD-10-CM

## 2024-03-20 DIAGNOSIS — R7309 Other abnormal glucose: Secondary | ICD-10-CM

## 2024-03-20 DIAGNOSIS — E538 Deficiency of other specified B group vitamins: Secondary | ICD-10-CM | POA: Diagnosis not present

## 2024-03-20 DIAGNOSIS — R319 Hematuria, unspecified: Secondary | ICD-10-CM

## 2024-03-20 NOTE — Assessment & Plan Note (Signed)
 Annual physical as above, Pap smear was done in the beginning of April. They would like some routine labs but also B12 and D, happy to order these. She did have a routine at work screening that did reveal abnormal LFTs and hematuria. We will check this again.

## 2024-03-20 NOTE — Progress Notes (Signed)
 Subjective:    CC: Annual Physical Exam  HPI:  This patient is here for their annual physical  I reviewed the past medical history, family history, social history, surgical history, and allergies today and no changes were needed.  Please see the problem list section below in epic for further details.  Past Medical History: Past Medical History:  Diagnosis Date   Anemia    Diabetes mellitus without complication (HCC)    Elevated hemoglobin A1c 04/29/2018   Gestational diabetes    High triglycerides 04/29/2018   Irregular periods/menstrual cycles    Low hemoglobin 04/29/2018   PCOS (polycystic ovarian syndrome)    Runner's knee    Vitamin D deficiency 04/29/2018   Past Surgical History: Past Surgical History:  Procedure Laterality Date   CESAREAN SECTION N/A 09/07/2017   Procedure: CESAREAN SECTION;  Surgeon: Shea Evans, MD;  Location: [facility] BIRTHING SUITES;  Service: Obstetrics;  Laterality: N/A;   Social History: Social History   Socioeconomic History   Marital status: Married    Spouse name: Not on file   Number of children: Not on file   Years of education: Not on file   Highest education level: Not on file  Occupational History   Not on file  Tobacco Use   Smoking status: Never   Smokeless tobacco: Never  Vaping Use   Vaping status: Never Used  Substance and Sexual Activity   Alcohol use: No   Drug use: No   Sexual activity: Yes  Other Topics Concern   Not on file  Social History Narrative   ** Merged History Encounter **       Social Drivers of Corporate investment banker Strain: Not on file  Food Insecurity: Not on file  Transportation Needs: Not on file  Physical Activity: Not on file  Stress: Not on file  Social Connections: Not on file   Family History: Family History  Problem Relation Age of Onset   Heart attack Father    Diabetes Father    Cancer Mother 71       breast   Diabetes Paternal Grandfather    Allergies: No Known  Allergies Medications: See med rec.  Review of Systems: No headache, visual changes, nausea, vomiting, diarrhea, constipation, dizziness, abdominal pain, skin rash, fevers, chills, night sweats, swollen lymph nodes, weight loss, chest pain, body aches, joint swelling, muscle aches, shortness of breath, mood changes, visual or auditory hallucinations.  Objective:    General: Well Developed, well nourished, and in no acute distress.  Neuro: Alert and oriented x3, extra-ocular muscles intact, sensation grossly intact. Cranial nerves II through XII are intact, motor, sensory, and coordinative functions are all intact. HEENT: Normocephalic, atraumatic, pupils equal round reactive to light, neck supple, no masses, no lymphadenopathy, thyroid nonpalpable. Oropharynx, nasopharynx, external ear canals are unremarkable. Skin: Warm and dry, no rashes noted.  Cardiac: Regular rate and rhythm, no murmurs rubs or gallops.  Respiratory: Clear to auscultation bilaterally. Not using accessory muscles, speaking in full sentences.  Abdominal: Soft, nontender, nondistended, positive bowel sounds, no masses, no organomegaly.  Musculoskeletal: Shoulder, elbow, wrist, hip, knee, ankle stable, and with full range of motion.  Impression and Recommendations:    The patient was counselled, risk factors were discussed, anticipatory guidance given.  Annual physical exam Annual physical as above, Pap smear was done in the beginning of April. They would like some routine labs but also B12 and D, happy to order these. She did have a routine at work  screening that did reveal abnormal LFTs and hematuria. We will check this again.   ____________________________________________ Ihor Austin. Benjamin Stain, M.D., ABFM., CAQSM., AME. Primary Care and Sports Medicine [facility]  Adjunct Professor of Family Medicine  [place] of [facility] of Medicine  Restaurant manager, fast food

## 2024-08-11 ENCOUNTER — Encounter: Payer: Self-pay | Admitting: Sports Medicine

## 2024-11-28 LAB — UA/M W/RFLX CULTURE, ROUTINE
Bilirubin, UA: NEGATIVE
Glucose, UA: NEGATIVE
Ketones, UA: NEGATIVE
Nitrite, UA: NEGATIVE
Protein,UA: NEGATIVE
Specific Gravity, UA: 1.013 (ref 1.005–1.030)
Urobilinogen, Ur: 0.2 mg/dL (ref 0.2–1.0)
pH, UA: 6.5 (ref 5.0–7.5)

## 2024-11-28 LAB — CBC
Hematocrit: 38.9 % (ref 34.0–46.6)
Hemoglobin: 13 g/dL (ref 11.1–15.9)
MCH: 30.3 pg (ref 26.6–33.0)
MCHC: 33.4 g/dL (ref 31.5–35.7)
MCV: 91 fL (ref 79–97)
Platelets: 344 x10E3/uL (ref 150–450)
RBC: 4.29 x10E6/uL (ref 3.77–5.28)
RDW: 12.2 % (ref 11.7–15.4)
WBC: 5.8 x10E3/uL (ref 3.4–10.8)

## 2024-11-28 LAB — COMPREHENSIVE METABOLIC PANEL WITH GFR
ALT: 11 IU/L (ref 0–32)
AST: 16 IU/L (ref 0–40)
Albumin: 4.7 g/dL (ref 3.9–4.9)
Alkaline Phosphatase: 48 IU/L (ref 41–116)
BUN/Creatinine Ratio: 11 (ref 9–23)
BUN: 7 mg/dL (ref 6–20)
Bilirubin Total: 0.7 mg/dL (ref 0.0–1.2)
CO2: 24 mmol/L (ref 20–29)
Calcium: 10.1 mg/dL (ref 8.7–10.2)
Chloride: 100 mmol/L (ref 96–106)
Creatinine, Ser: 0.66 mg/dL (ref 0.57–1.00)
Globulin, Total: 2.3 g/dL (ref 1.5–4.5)
Glucose: 101 mg/dL — ABNORMAL HIGH (ref 70–99)
Potassium: 4.6 mmol/L (ref 3.5–5.2)
Sodium: 138 mmol/L (ref 134–144)
Total Protein: 7 g/dL (ref 6.0–8.5)
eGFR: 115 mL/min/1.73

## 2024-11-28 LAB — MICROSCOPIC EXAMINATION
Bacteria, UA: NONE SEEN
Casts: NONE SEEN /LPF

## 2024-11-28 LAB — LIPID PANEL
Chol/HDL Ratio: 3.9 ratio (ref 0.0–4.4)
Cholesterol, Total: 185 mg/dL (ref 100–199)
HDL: 48 mg/dL
LDL Chol Calc (NIH): 123 mg/dL — ABNORMAL HIGH (ref 0–99)
Triglycerides: 78 mg/dL (ref 0–149)
VLDL Cholesterol Cal: 14 mg/dL (ref 5–40)

## 2024-11-28 LAB — HEMOGLOBIN A1C
Est. average glucose Bld gHb Est-mCnc: 114 mg/dL
Hgb A1c MFr Bld: 5.6 % (ref 4.8–5.6)

## 2024-11-28 LAB — URINE CULTURE, REFLEX

## 2024-11-28 LAB — VITAMIN D 25 HYDROXY (VIT D DEFICIENCY, FRACTURES): Vit D, 25-Hydroxy: 16.3 ng/mL — ABNORMAL LOW (ref 30.0–100.0)

## 2024-11-28 LAB — VITAMIN B12: Vitamin B-12: 392 pg/mL (ref 232–1245)

## 2024-11-28 LAB — TSH: TSH: 3.25 u[IU]/mL (ref 0.450–4.500)

## 2024-12-14 ENCOUNTER — Telehealth: Payer: Self-pay

## 2024-12-14 NOTE — Telephone Encounter (Signed)
 Please notify pt that overall labs look great. Vit D in 16, which is still deficient. She should take OTC Vit D3 2,000IU daily. She needs to get scheduled in April for her annual PE with one of us . Thanks.

## 2024-12-14 NOTE — Telephone Encounter (Signed)
 Patient came by and did old lab orders from Dr Curtis. She would like to know the results.

## 2024-12-14 NOTE — Telephone Encounter (Signed)
 Copied from CRM [id number]. Topic: Clinical - Lab/Test Results >> Dec 14, 2024 11:47 AM Nathanel BROCKS wrote: Reason for CRM: Pt called and wants a call back to discuss lab work results.

## 2024-12-14 NOTE — Telephone Encounter (Signed)
"  Patient advised  "

## 2024-12-25 ENCOUNTER — Ambulatory Visit: Admitting: Urgent Care
